# Patient Record
Sex: Male | Born: 1990
Health system: Southern US, Community
[De-identification: ages and names within clinical notes are randomized; demographics above are authoritative.]

## PROBLEM LIST (undated history)

## (undated) DIAGNOSIS — F319 Bipolar disorder, unspecified: Secondary | ICD-10-CM

## (undated) DIAGNOSIS — R519 Headache, unspecified: Secondary | ICD-10-CM

## (undated) DIAGNOSIS — T7840XA Allergy, unspecified, initial encounter: Secondary | ICD-10-CM

## (undated) DIAGNOSIS — R51 Headache: Secondary | ICD-10-CM

## (undated) DIAGNOSIS — F4310 Post-traumatic stress disorder, unspecified: Secondary | ICD-10-CM

## (undated) HISTORY — DX: Headache, unspecified: R51.9

## (undated) HISTORY — DX: Bipolar disorder, unspecified: F31.9

## (undated) HISTORY — PX: WISDOM TOOTH EXTRACTION: SHX21

## (undated) HISTORY — DX: Allergy, unspecified, initial encounter: T78.40XA

## (undated) HISTORY — DX: Headache: R51

---

## 2012-09-29 ENCOUNTER — Emergency Department (HOSPITAL_COMMUNITY): Payer: Self-pay

## 2012-09-29 ENCOUNTER — Encounter (HOSPITAL_COMMUNITY): Payer: Self-pay | Admitting: *Deleted

## 2012-09-29 ENCOUNTER — Emergency Department (HOSPITAL_COMMUNITY)
Admission: EM | Admit: 2012-09-29 | Discharge: 2012-09-29 | Disposition: A | Discharge status: Home / Self Care | Payer: Self-pay | Attending: Emergency Medicine | Admitting: Emergency Medicine

## 2012-09-29 DIAGNOSIS — S43409A Unspecified sprain of unspecified shoulder joint, initial encounter: Secondary | ICD-10-CM

## 2012-09-29 DIAGNOSIS — T733XXA Exhaustion due to excessive exertion, initial encounter: Secondary | ICD-10-CM | POA: Insufficient documentation

## 2012-09-29 DIAGNOSIS — Y929 Unspecified place or not applicable: Secondary | ICD-10-CM | POA: Insufficient documentation

## 2012-09-29 DIAGNOSIS — Y93B1 Activity, exercise machines primarily for muscle strengthening: Secondary | ICD-10-CM | POA: Insufficient documentation

## 2012-09-29 DIAGNOSIS — IMO0002 Reserved for concepts with insufficient information to code with codable children: Secondary | ICD-10-CM | POA: Insufficient documentation

## 2012-09-29 NOTE — ED Notes (Signed)
R shoulder pain 10/10 w/active ROM beyond 90 degrees laterally and vertically.  Full passive ROM intact w/out pain elicited.

## 2012-09-29 NOTE — ED Provider Notes (Signed)
Medical screening examination/treatment/procedure(s) were performed by non-physician practitioner and as supervising physician I was immediately available for consultation/collaboration.   Ginni Eichler L Danne Vasek, MD 09/29/12 2323 

## 2012-09-29 NOTE — ED Notes (Signed)
Lifting weights when pt heard a "popping" noise and felt left shoulder "snap".  C/o decreased ROM and pain to left shoulder.

## 2012-09-29 NOTE — ED Provider Notes (Signed)
History     CSN: [id number]  Arrival date & time 09/29/12  1452   None     Chief Complaint  Patient presents with  . Shoulder Injury    (Consider location/radiation/quality/duration/timing/severity/associated sxs/prior treatment) HPI Comments: Pt was doing squat while holding ~ 150 lbs.  The bar shifted and depressed the L shoulder and he has had pain in since.  R hand dominant.  The history is provided by the patient. No language interpreter was used.    History reviewed. No pertinent past medical history.  History reviewed. No pertinent past surgical history.  No family history on file.  History  Substance Use Topics  . Smoking status: Never Smoker   . Smokeless tobacco: Not on file  . Alcohol Use: No      Review of Systems  Musculoskeletal:       Shoulder injury and pain   Skin: Negative for wound.  All other systems reviewed and are negative.    Allergies  Review of patient's allergies indicates no known allergies.  Home Medications  No current outpatient prescriptions on file.  BP 120/57  Pulse 97  Temp 97.9 F (36.6 C) (Oral)  Resp 20  Ht 5\' 8"  (1.727 m)  Wt 152 lb (68.947 kg)  BMI 23.11 kg/m2  SpO2 100%  Physical Exam  Nursing note and vitals reviewed. Constitutional: He is oriented to person, place, and time. He appears well-developed and well-nourished.  HENT:  Head: Normocephalic and atraumatic.  Eyes: EOM are normal.  Neck: Normal range of motion.  Cardiovascular: Normal rate, regular rhythm, normal heart sounds and intact distal pulses.   Pulmonary/Chest: Effort normal and breath sounds normal. No respiratory distress.  Abdominal: Soft. He exhibits no distension. There is no tenderness.  Musculoskeletal: He exhibits tenderness.       Left shoulder: He exhibits decreased range of motion, tenderness and pain. He exhibits no bony tenderness, no swelling, no effusion, no crepitus, no deformity, no laceration, no spasm, normal pulse and  normal strength.       Arms: Neurological: He is alert and oriented to person, place, and time.  Skin: Skin is warm and dry.  Psychiatric: He has a normal mood and affect. Judgment normal.    ED Course  Procedures (including critical care time)  Labs Reviewed - No data to display Dg Shoulder Left  09/29/2012  *RADIOLOGY REPORT*  Clinical Data: 21 year old male with left shoulder injury and pain.  LEFT SHOULDER - 2+ VIEW  Comparison: None  Findings: There is no evidence of acute bony abnormality. There is no evidence of acute fracture, subluxation, or dislocation. No focal bony lesions are identified. The visualized left hemithorax is unremarkable.  IMPRESSION: Unremarkable left shoulder.   Original Report Authenticated By: Rosendo Gros, M.D.      1. Shoulder sprain       MDM  Sling, ice, ibuprofen F/u with dr. Romeo Apple.        Evalina Field, [place] 09/29/12 [phone]

## 2012-09-29 NOTE — ED Notes (Signed)
Patient with no complaints at this time. Respirations even and unlabored. Skin warm/dry. Discharge instructions reviewed with patient at this time. Patient given opportunity to voice concerns/ask questions. Patient discharged at this time and left Emergency Department with steady gait.   

## 2014-06-25 ENCOUNTER — Telehealth: Payer: Self-pay | Admitting: Family Medicine

## 2014-06-25 NOTE — Telephone Encounter (Signed)
Pt's twin brother and mother sees you. Pt doesn't have a pcp and would like to know if you will accept him as a pt? pls advise

## 2014-06-25 NOTE — Telephone Encounter (Signed)
Yes I can see him  ?

## 2014-06-26 NOTE — Telephone Encounter (Signed)
NA, try back later.

## 2014-06-27 NOTE — Telephone Encounter (Signed)
appt scheduled

## 2014-08-06 ENCOUNTER — Encounter: Payer: Self-pay | Admitting: Family Medicine

## 2014-08-06 ENCOUNTER — Ambulatory Visit (INDEPENDENT_AMBULATORY_CARE_PROVIDER_SITE_OTHER): Payer: BC Managed Care – PPO | Admitting: Family Medicine

## 2014-08-06 VITALS — BP 103/61 | HR 88 | Temp 99.0°F | Ht 69.0 in | Wt 156.0 lb

## 2014-08-06 DIAGNOSIS — Z0000 Encounter for general adult medical examination without abnormal findings: Secondary | ICD-10-CM

## 2014-08-06 LAB — LIPID PANEL
CHOLESTEROL: 114 mg/dL (ref 0–200)
HDL: 52 mg/dL (ref >39.00)
LDL CALC: 54 mg/dL (ref 0–99)
NonHDL: 62
Total CHOL/HDL Ratio: 2
Triglycerides: 42 mg/dL (ref 0.0–149.0)
VLDL: 8.4 mg/dL (ref 0.0–40.0)

## 2014-08-06 LAB — BASIC METABOLIC PANEL
BUN: 12 mg/dL (ref 6–23)
CO2: 29 meq/L (ref 19–32)
Calcium: 9.1 mg/dL (ref 8.4–10.5)
Chloride: 104 mEq/L (ref 96–112)
Creatinine, Ser: 1 mg/dL (ref 0.4–1.5)
GFR: 97.53 mL/min (ref >60.00)
GLUCOSE: 83 mg/dL (ref 70–99)
POTASSIUM: 3.5 meq/L (ref 3.5–5.1)
SODIUM: 141 meq/L (ref 135–145)

## 2014-08-06 LAB — CBC WITH DIFFERENTIAL/PLATELET
BASOS PCT: 1 % (ref 0.0–3.0)
Basophils Absolute: 0 10*3/uL (ref 0.0–0.1)
EOS ABS: 0 10*3/uL (ref 0.0–0.7)
EOS PCT: 1 % (ref 0.0–5.0)
HEMATOCRIT: 41 % (ref 39.0–52.0)
HEMOGLOBIN: 14 g/dL (ref 13.0–17.0)
LYMPHS ABS: 1.6 10*3/uL (ref 0.7–4.0)
Lymphocytes Relative: 35.8 % (ref 12.0–46.0)
MCHC: 34.1 g/dL (ref 30.0–36.0)
MCV: 88.8 fl (ref 78.0–100.0)
MONO ABS: 0.4 10*3/uL (ref 0.1–1.0)
Monocytes Relative: 9.2 % (ref 3.0–12.0)
NEUTROS ABS: 2.4 10*3/uL (ref 1.4–7.7)
NEUTROS PCT: 53 % (ref 43.0–77.0)
Platelets: 210 10*3/uL (ref 150.0–400.0)
RBC: 4.61 Mil/uL (ref 4.22–5.81)
RDW: 12.4 % (ref 11.5–15.5)
WBC: 4.4 10*3/uL (ref 4.0–10.5)

## 2014-08-06 LAB — POCT URINALYSIS DIPSTICK
BILIRUBIN UA: NEGATIVE
Glucose, UA: NEGATIVE
Ketones, UA: NEGATIVE
Leukocytes, UA: NEGATIVE
NITRITE UA: NEGATIVE
PH UA: 7.5
Protein, UA: NEGATIVE
RBC UA: NEGATIVE
SPEC GRAV UA: 1.01
Urobilinogen, UA: 0.2

## 2014-08-06 LAB — HEPATIC FUNCTION PANEL
ALBUMIN: 4.5 g/dL (ref 3.5–5.2)
ALK PHOS: 58 U/L (ref 39–117)
ALT: 24 U/L (ref 0–53)
AST: 38 U/L — AB (ref 0–37)
BILIRUBIN DIRECT: 0 mg/dL (ref 0.0–0.3)
Total Bilirubin: 0.7 mg/dL (ref 0.2–1.2)
Total Protein: 7.4 g/dL (ref 6.0–8.3)

## 2014-08-06 LAB — TSH: TSH: 0.69 u[IU]/mL (ref 0.35–4.50)

## 2014-08-06 NOTE — Progress Notes (Signed)
   Subjective:    Patient ID: David [name], male    DOB: 1991-09-21, 23 y.o.   MRN: [id number]  HPI 23 yr old male to establish and for a cpx. He feels great. He is working part time at [employer] and he takes classes at [employer] to study [employer].    Review of Systems  Constitutional: Negative.   HENT: Negative.   Eyes: Negative.   Respiratory: Negative.   Cardiovascular: Negative.   Gastrointestinal: Negative.   Genitourinary: Negative.   Musculoskeletal: Negative.   Skin: Negative.   Neurological: Negative.   Psychiatric/Behavioral: Negative.        Objective:   Physical Exam  Constitutional: He is oriented to person, place, and time. He appears well-developed and well-nourished. No distress.  HENT:  Head: Normocephalic and atraumatic.  Right Ear: External ear normal.  Left Ear: External ear normal.  Nose: Nose normal.  Mouth/Throat: Oropharynx is clear and moist. No oropharyngeal exudate.  Eyes: Conjunctivae and EOM are normal. Pupils are equal, round, and reactive to light. Right eye exhibits no discharge. Left eye exhibits no discharge. No scleral icterus.  Neck: Neck supple. No JVD present. No tracheal deviation present. No thyromegaly present.  Cardiovascular: Normal rate, regular rhythm, normal heart sounds and intact distal pulses.  Exam reveals no gallop and no friction rub.   No murmur heard. Pulmonary/Chest: Effort normal and breath sounds normal. No respiratory distress. He has no wheezes. He has no rales. He exhibits no tenderness.  Abdominal: Soft. Bowel sounds are normal. He exhibits no distension and no mass. There is no tenderness. There is no rebound and no guarding.  Genitourinary: Rectum normal, prostate normal and penis normal. Guaiac negative stool. No penile tenderness.  Musculoskeletal: Normal range of motion. He exhibits no edema and no tenderness.  Lymphadenopathy:    He has no cervical adenopathy.  Neurological: He is alert and  oriented to person, place, and time. He has normal reflexes. No cranial nerve deficit. He exhibits normal muscle tone. Coordination normal.  Skin: Skin is warm and dry. No rash noted. He is not diaphoretic. No erythema. No pallor.  Psychiatric: He has a normal mood and affect. His behavior is normal. Judgment and thought content normal.          Assessment & Plan:  Well exam. Get labs today

## 2014-08-06 NOTE — Progress Notes (Signed)
Pre visit review using our clinic review tool, if applicable. No additional management support is needed unless otherwise documented below in the visit note. 

## 2017-03-29 ENCOUNTER — Encounter: Payer: Self-pay | Admitting: Family Medicine

## 2017-03-29 ENCOUNTER — Ambulatory Visit (INDEPENDENT_AMBULATORY_CARE_PROVIDER_SITE_OTHER): Payer: BLUE CROSS/BLUE SHIELD | Admitting: Family Medicine

## 2017-03-29 VITALS — BP 131/79 | HR 84 | Temp 98.7°F | Ht 69.0 in | Wt 173.0 lb

## 2017-03-29 DIAGNOSIS — M542 Cervicalgia: Secondary | ICD-10-CM | POA: Diagnosis not present

## 2017-03-29 DIAGNOSIS — G8929 Other chronic pain: Secondary | ICD-10-CM | POA: Diagnosis not present

## 2017-03-29 MED ORDER — DICLOFENAC SODIUM 75 MG PO TBEC: 75.0000 mg | DELAYED_RELEASE_TABLET | Freq: Two times a day (BID) | ORAL | 0 refills | Status: DC

## 2017-03-29 NOTE — Progress Notes (Signed)
   Subjective:    Patient ID: David [name], male    DOB: 1990/12/21, 26 y.o.   MRN: [id number]  HPI Here for a neck pain that started 3 years ago while he was lifting weights. That day he felt a sudden severe pain that eased up over several days, but a dull pain has lasted ever since. He feels stiffness when he sits for a long time or gets out of bed. Advil and heat do not help. He has continued to lift weights regularly.    Review of Systems  Constitutional: Negative.   Musculoskeletal: Positive for neck pain and neck stiffness.       Objective:   Physical Exam  Constitutional: He appears well-developed and well-nourished. No distress.  Musculoskeletal:  Slightly tender over the lower neck around the C6-C7 level. Full ROM          Assessment & Plan:  Chronic neck pain. Try Diclofenac prn. Refer to Sports Medicine.  Gershon Crane, MD

## 2017-03-29 NOTE — Patient Instructions (Signed)
WE NOW OFFER   [facility] FAST TRACK!!!  SAME DAY Appointments for ACUTE CARE  Such as: Sprains, Injuries, cuts, abrasions, rashes, muscle pain, joint pain, back pain Colds, flu, sore throats, headache, allergies, cough, fever  Ear pain, sinus and eye infections Abdominal pain, nausea, vomiting, diarrhea, upset stomach Animal/insect bites  3 Easy Ways to Schedule: Walk-In Scheduling Call in scheduling Mychart Sign-up: [email or web address]         

## 2017-03-29 NOTE — Progress Notes (Signed)
Pre visit review using our clinic review tool, if applicable. No additional management support is needed unless otherwise documented below in the visit note. 

## 2018-06-27 ENCOUNTER — Encounter (HOSPITAL_COMMUNITY): Payer: Self-pay

## 2018-06-27 ENCOUNTER — Emergency Department (HOSPITAL_COMMUNITY)
Admission: EM | Admit: 2018-06-27 | Discharge: 2018-06-28 | Disposition: A | Discharge status: Home / Self Care | Payer: BLUE CROSS/BLUE SHIELD | Attending: Emergency Medicine | Admitting: Emergency Medicine

## 2018-06-27 ENCOUNTER — Other Ambulatory Visit: Payer: Self-pay

## 2018-06-27 DIAGNOSIS — Z889 Allergy status to unspecified drugs, medicaments and biological substances status: Secondary | ICD-10-CM | POA: Insufficient documentation

## 2018-06-27 DIAGNOSIS — R251 Tremor, unspecified: Secondary | ICD-10-CM | POA: Diagnosis not present

## 2018-06-27 DIAGNOSIS — F419 Anxiety disorder, unspecified: Secondary | ICD-10-CM | POA: Diagnosis not present

## 2018-06-27 DIAGNOSIS — Z79899 Other long term (current) drug therapy: Secondary | ICD-10-CM | POA: Diagnosis not present

## 2018-06-27 DIAGNOSIS — T428X5A Adverse effect of antiparkinsonism drugs and other central muscle-tone depressants, initial encounter: Secondary | ICD-10-CM | POA: Diagnosis not present

## 2018-06-27 DIAGNOSIS — R000 Tachycardia, unspecified: Secondary | ICD-10-CM | POA: Diagnosis not present

## 2018-06-27 DIAGNOSIS — T50905A Adverse effect of unspecified drugs, medicaments and biological substances, initial encounter: Secondary | ICD-10-CM

## 2018-06-27 LAB — PROTIME-INR
INR: 1.1
PROTHROMBIN TIME: 14.1 s (ref 11.4–15.2)

## 2018-06-27 LAB — DIFFERENTIAL
Abs Immature Granulocytes: 0 10*3/uL (ref 0.0–0.1)
BASOS ABS: 0.1 10*3/uL (ref 0.0–0.1)
Basophils Relative: 1 %
EOS ABS: 0.1 10*3/uL (ref 0.0–0.7)
EOS PCT: 1 %
IMMATURE GRANULOCYTES: 0 %
LYMPHS PCT: 28 %
Lymphs Abs: 2.1 10*3/uL (ref 0.7–4.0)
MONO ABS: 0.8 10*3/uL (ref 0.1–1.0)
Monocytes Relative: 10 %
NEUTROS PCT: 60 %
Neutro Abs: 4.4 10*3/uL (ref 1.7–7.7)

## 2018-06-27 LAB — COMPREHENSIVE METABOLIC PANEL
ALBUMIN: 4.8 g/dL (ref 3.5–5.0)
ALK PHOS: 64 U/L (ref 38–126)
ALT: 21 U/L (ref 0–44)
AST: 26 U/L (ref 15–41)
Anion gap: 12 (ref 5–15)
BILIRUBIN TOTAL: 0.6 mg/dL (ref 0.3–1.2)
BUN: 12 mg/dL (ref 6–20)
CALCIUM: 9.3 mg/dL (ref 8.9–10.3)
CO2: 28 mmol/L (ref 22–32)
CREATININE: 1.12 mg/dL (ref 0.61–1.24)
Chloride: 99 mmol/L (ref 98–111)
GFR calc non Af Amer: >60 mL/min (ref >60)
GLUCOSE: 92 mg/dL (ref 70–99)
Potassium: 3.7 mmol/L (ref 3.5–5.1)
SODIUM: 139 mmol/L (ref 135–145)
Total Protein: 7.3 g/dL (ref 6.5–8.1)

## 2018-06-27 LAB — I-STAT CHEM 8, ED
BUN: 14 mg/dL (ref 6–20)
CALCIUM ION: 1.1 mmol/L — AB (ref 1.15–1.40)
CHLORIDE: 96 mmol/L — AB (ref 98–111)
Creatinine, Ser: 1.2 mg/dL (ref 0.61–1.24)
GLUCOSE: 92 mg/dL (ref 70–99)
HCT: 43 % (ref 39.0–52.0)
Hemoglobin: 14.6 g/dL (ref 13.0–17.0)
Potassium: 3.6 mmol/L (ref 3.5–5.1)
Sodium: 139 mmol/L (ref 135–145)
TCO2: 30 mmol/L (ref 22–32)

## 2018-06-27 LAB — CBC
HCT: 43.5 % (ref 39.0–52.0)
Hemoglobin: 14.8 g/dL (ref 13.0–17.0)
MCH: 29.8 pg (ref 26.0–34.0)
MCHC: 34 g/dL (ref 30.0–36.0)
MCV: 87.7 fL (ref 78.0–100.0)
PLATELETS: 259 10*3/uL (ref 150–400)
RBC: 4.96 MIL/uL (ref 4.22–5.81)
RDW: 11.4 % — AB (ref 11.5–15.5)
WBC: 7.5 10*3/uL (ref 4.0–10.5)

## 2018-06-27 LAB — APTT: APTT: 30 s (ref 24–36)

## 2018-06-27 LAB — I-STAT TROPONIN, ED: Troponin i, poc: 0 ng/mL (ref 0.00–0.08)

## 2018-06-27 NOTE — ED Provider Notes (Addendum)
[facility] EMERGENCY DEPARTMENT Provider Note   CSN: [id number] Arrival date & time: 06/27/18  2106     History   Chief Complaint Chief Complaint  Patient presents with  . Allergic Reaction    HPI David [name] is a 27 y.o. male.  The history is provided by the patient.  Allergic Reaction  Presenting symptoms: no difficulty breathing, no difficulty swallowing, no itching, no rash, no swelling and no wheezing   Severity:  Mild Prior allergic episodes:  No prior episodes Context: medications   Context: not new detergents/soaps   Relieved by:  Nothing Worsened by:  Nothing Ineffective treatments:  None tried Ingestion  This is a new problem. The current episode started 12 to 24 hours ago. The problem occurs constantly. The problem has not changed since onset.Pertinent negatives include no chest pain and no abdominal pain. Nothing aggravates the symptoms. Nothing relieves the symptoms. He has tried nothing for the symptoms. The treatment provided significant relief.  Patient feels anxious after taking a bunch of over the counter supplements including dopa macuna.  Felt jittery.  Has not been unresponsive but has felt shaky.  No loss of control of the bowels or bladder.  No tongue biting.  No wheezing no rashes on the skin.  No swelling of the lips tongue or uvula.  Family is concerned about ongoing brain fog and delusional issues.    Past Medical History:  Diagnosis Date  . Allergy     Patient Active Problem List   Diagnosis Date Noted  . Chronic neck pain 03/29/2017    History reviewed. No pertinent surgical history.      Home Medications    Prior to Admission medications   Medication Sig Start Date End Date Taking? Authorizing Provider  diclofenac (VOLTAREN) 75 MG EC tablet Take 1 tablet (75 mg total) by mouth 2 (two) times daily. 03/29/17   Nelwyn Salisbury, MD    Family History Family History  Problem Relation Age of Onset  . Fibromyalgia  Brother     Social History Social History   Tobacco Use  . Smoking status: Never Smoker  . Smokeless tobacco: Never Used  Substance Use Topics  . Alcohol use: No  . Drug use: No     Allergies   Patient has no known allergies.   Review of Systems Review of Systems  Constitutional: Negative for fever.  HENT: Negative for trouble swallowing.   Eyes: Negative for photophobia and visual disturbance.  Respiratory: Negative for choking, chest tightness and wheezing.   Cardiovascular: Negative for chest pain and leg swelling.  Gastrointestinal: Negative for abdominal pain.  Genitourinary: Negative for flank pain.  Skin: Negative for itching and rash.  Neurological: Negative for seizures and syncope.  Psychiatric/Behavioral: Negative for agitation, behavioral problems, confusion, decreased concentration, dysphoric mood, hallucinations, self-injury, sleep disturbance and suicidal ideas. The patient is nervous/anxious. The patient is not hyperactive.   All other systems reviewed and are negative.    Physical Exam Updated Vital Signs BP (!) 161/85 (BP Location: Right Arm)   Pulse (!) 114   Temp 98.5 F (36.9 C) (Oral)   Resp 18   Ht 5\' 9"  (1.753 m)   Wt 70.3 kg (155 lb)   SpO2 99%   BMI 22.89 kg/m   Physical Exam  Constitutional: He is oriented to person, place, and time. He appears well-developed and well-nourished. No distress.  HENT:  Head: Normocephalic and atraumatic.  Mouth/Throat: Oropharynx is clear and moist. No oropharyngeal  exudate.  Eyes: Pupils are equal, round, and reactive to light. Conjunctivae are normal.  Neck: Normal range of motion. Neck supple.  Cardiovascular: Normal rate, regular rhythm, normal heart sounds and intact distal pulses.  Pulmonary/Chest: Effort normal and breath sounds normal. No stridor. No respiratory distress. He has no wheezes. He has no rales.  Abdominal: Soft. Bowel sounds are normal. He exhibits no mass. There is no tenderness.  There is no rebound and no guarding.  Musculoskeletal: Normal range of motion.  Neurological: He is alert and oriented to person, place, and time. He displays normal reflexes. No cranial nerve deficit. He exhibits normal muscle tone. Coordination normal.  Skin: Skin is warm and dry. Capillary refill takes less than 2 seconds.  Psychiatric: His mood appears not anxious. His speech is not rapid and/or pressured, not delayed and not tangential. Thought content is not paranoid. He expresses no homicidal and no suicidal ideation. He expresses no suicidal plans and no homicidal plans.  Patient is currently alert and oriented.  No si or HI no AH or VH.    Nursing note and vitals reviewed.    ED Treatments / Results  Labs (all labs ordered are listed, but only abnormal results are displayed) Results for orders placed or performed during the hospital encounter of 06/27/18  Protime-INR  Result Value Ref Range   Prothrombin Time 14.1 11.4 - 15.2 seconds   INR 1.10   APTT  Result Value Ref Range   aPTT 30 24 - 36 seconds  CBC  Result Value Ref Range   WBC 7.5 4.0 - 10.5 K/uL   RBC 4.96 4.22 - 5.81 MIL/uL   Hemoglobin 14.8 13.0 - 17.0 g/dL   HCT 96.043.5 45.439.0 - 09.852.0 %   MCV 87.7 78.0 - 100.0 fL   MCH 29.8 26.0 - 34.0 pg   MCHC 34.0 30.0 - 36.0 g/dL   RDW 11.911.4 (L) 14.711.5 - 82.915.5 %   Platelets 259 150 - 400 K/uL  Differential  Result Value Ref Range   Neutrophils Relative % 60 %   Neutro Abs 4.4 1.7 - 7.7 K/uL   Lymphocytes Relative 28 %   Lymphs Abs 2.1 0.7 - 4.0 K/uL   Monocytes Relative 10 %   Monocytes Absolute 0.8 0.1 - 1.0 K/uL   Eosinophils Relative 1 %   Eosinophils Absolute 0.1 0.0 - 0.7 K/uL   Basophils Relative 1 %   Basophils Absolute 0.1 0.0 - 0.1 K/uL   Immature Granulocytes 0 %   Abs Immature Granulocytes 0.0 0.0 - 0.1 K/uL  Comprehensive metabolic panel  Result Value Ref Range   Sodium 139 135 - 145 mmol/L   Potassium 3.7 3.5 - 5.1 mmol/L   Chloride 99 98 - 111 mmol/L    CO2 28 22 - 32 mmol/L   Glucose, Bld 92 70 - 99 mg/dL   BUN 12 6 - 20 mg/dL   Creatinine, Ser 5.621.12 0.61 - 1.24 mg/dL   Calcium 9.3 8.9 - 13.010.3 mg/dL   Total Protein 7.3 6.5 - 8.1 g/dL   Albumin 4.8 3.5 - 5.0 g/dL   AST 26 15 - 41 U/L   ALT 21 0 - 44 U/L   Alkaline Phosphatase 64 38 - 126 U/L   Total Bilirubin 0.6 0.3 - 1.2 mg/dL   GFR calc non Af Amer >60 >60 mL/min   GFR calc Af Amer >60 >60 mL/min   Anion gap 12 5 - 15  I-stat troponin, ED  Result Value Ref  Range   Troponin i, poc 0.00 0.00 - 0.08 ng/mL   Comment 3          I-Stat Chem 8, ED  Result Value Ref Range   Sodium 139 135 - 145 mmol/L   Potassium 3.6 3.5 - 5.1 mmol/L   Chloride 96 (L) 98 - 111 mmol/L   BUN 14 6 - 20 mg/dL   Creatinine, Ser 1.61 0.61 - 1.24 mg/dL   Glucose, Bld 92 70 - 99 mg/dL   Calcium, Ion 0.96 (L) 1.15 - 1.40 mmol/L   TCO2 30 22 - 32 mmol/L   Hemoglobin 14.6 13.0 - 17.0 g/dL   HCT 04.5 40.9 - 81.1 %   No results found.  EKG EKG Interpretation  Date/Time:  Wednesday June 27 2018 21:13:50 EDT Ventricular Rate:  113 PR Interval:  134 QRS Duration: 100 QT Interval:  318 QTC Calculation: 436 R Axis:   54 Text Interpretation:  Sinus tachycardia Right atrial enlargement Moderate voltage criteria for LVH, may be normal variant Borderline ECG No old tracing to compare Confirmed by Melene Plan [id number]) on 06/27/2018 10:21:11 PM   Radiology No results found.  Procedures Procedures (including critical care time)   Final Clinical Impressions(s) / ED Diagnoses  Stop using all these substances. Follow up with your PMD, appointment is this am at 1030.    Return for pain, numbness, changes in vision or speech, fevers >100.4 unrelieved by medication, shortness of breath, intractable vomiting, or diarrhea, abdominal pain, Inability to tolerate liquids or food, cough, altered mental status or any concerns. No signs of systemic illness or infection. The patient is nontoxic-appearing on exam and vital  signs are within normal limits. Will refer to urology for microscopy hematuria as patient is asymptomatic.  I have reviewed the triage vital signs and the nursing notes. Pertinent labs &imaging results that were available during my care of the patient were reviewed by me and considered in my medical decision making (see chart for details).  After history, exam, and medical workup I feel the patient has been appropriately medically screened and is safe for discharge home. Pertinent diagnoses were discussed with the patient. Patient was given return precautions.   Yeray Tomas, MD 06/28/18 [place], Khyan Oats, MD 06/28/18 Azzie Roup, Ladina Shutters, MD 06/28/18 0130

## 2018-06-27 NOTE — ED Triage Notes (Signed)
Pt states that he has been using herbal supplements and tried a new one today called mucuna dopa, pt states that afterwards shaking, anxious and pt thinks that he has been having seizures, reports he was unable to walk, speak and confused for 15 minutes. LSN 5pm neuro intact bilaterally. Reports dizziness now

## 2018-06-28 ENCOUNTER — Other Ambulatory Visit: Payer: Self-pay

## 2018-06-28 ENCOUNTER — Encounter (HOSPITAL_COMMUNITY): Payer: Self-pay | Admitting: Emergency Medicine

## 2018-06-28 ENCOUNTER — Ambulatory Visit: Payer: BLUE CROSS/BLUE SHIELD | Admitting: Family Medicine

## 2018-06-28 LAB — ACETAMINOPHEN LEVEL: Acetaminophen (Tylenol), Serum: <10 ug/mL — ABNORMAL LOW (ref 10–30)

## 2018-06-28 LAB — RAPID URINE DRUG SCREEN, HOSP PERFORMED
Amphetamines: NOT DETECTED
BARBITURATES: NOT DETECTED
BENZODIAZEPINES: NOT DETECTED
COCAINE: NOT DETECTED
Opiates: NOT DETECTED
TETRAHYDROCANNABINOL: NOT DETECTED

## 2018-06-28 LAB — SALICYLATE LEVEL: Salicylate Lvl: <7 mg/dL (ref 2.8–30.0)

## 2018-06-28 NOTE — ED Notes (Signed)
Pt ambulated in hallway on own ability. Pt has steady normal gait. No complaints.

## 2018-06-28 NOTE — ED Notes (Signed)
Pt given ginger ale soda and water

## 2018-07-02 ENCOUNTER — Encounter: Payer: Self-pay | Admitting: Family Medicine

## 2018-07-02 ENCOUNTER — Ambulatory Visit (INDEPENDENT_AMBULATORY_CARE_PROVIDER_SITE_OTHER): Payer: BLUE CROSS/BLUE SHIELD | Admitting: Family Medicine

## 2018-07-02 VITALS — BP 120/88 | HR 76 | Temp 98.1°F | Ht 69.0 in | Wt 152.6 lb

## 2018-07-02 DIAGNOSIS — R51 Headache: Secondary | ICD-10-CM

## 2018-07-02 DIAGNOSIS — F22 Delusional disorders: Secondary | ICD-10-CM | POA: Diagnosis not present

## 2018-07-02 DIAGNOSIS — R519 Headache, unspecified: Secondary | ICD-10-CM

## 2018-07-02 DIAGNOSIS — F419 Anxiety disorder, unspecified: Secondary | ICD-10-CM

## 2018-07-02 MED ORDER — LORAZEPAM 0.5 MG PO TABS: 0.5000 mg | ORAL_TABLET | Freq: Two times a day (BID) | ORAL | 0 refills | Status: DC | PRN
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
## 2018-07-03 ENCOUNTER — Telehealth: Payer: Self-pay | Admitting: *Deleted

## 2018-07-03 NOTE — Telephone Encounter (Signed)
Pt's CT scheduled for tomorrow.

## 2018-07-03 NOTE — Telephone Encounter (Signed)
[place]'s father [place] called in and spoke w/ [facility] to check on status of scheduling CT scan and requested to know location of scan. Advised agent that scan was just ordered yesterday evening and was still undergoing pre-cert, etc and that location is [place] [address] Agent relayed info to father, who told her that he called "[facility]" and they told him they only do consultations but not CT scans. He could not elaborate on which [place] office he called. Father is requesting to have scan scheduled ASAP.   Discussed w/ Dr. Clent RidgesFry to see if he wanted the order placed stat, but he said scheduling within 1 week is okay. Deb/Sheena, can you check on status of imaging authorization and call patient's father with an update today please?

## 2018-07-04 ENCOUNTER — Ambulatory Visit (INDEPENDENT_AMBULATORY_CARE_PROVIDER_SITE_OTHER)
Admission: RE | Admit: 2018-07-04 | Discharge: 2018-07-04 | Disposition: A | Discharge status: Home / Self Care | Payer: BLUE CROSS/BLUE SHIELD | Source: Ambulatory Visit | Attending: Family Medicine | Admitting: Family Medicine

## 2018-07-04 DIAGNOSIS — R51 Headache: Secondary | ICD-10-CM

## 2018-07-04 DIAGNOSIS — R519 Headache, unspecified: Secondary | ICD-10-CM

## 2018-07-05 ENCOUNTER — Telehealth: Payer: Self-pay | Admitting: Family Medicine

## 2018-07-05 NOTE — Telephone Encounter (Signed)
Stacy from [facility] has already scheduled pt mother is aware

## 2018-07-05 NOTE — Telephone Encounter (Signed)
Stacey from [facility] scheduled pt mother is aware

## 2018-07-06 ENCOUNTER — Ambulatory Visit: Payer: BLUE CROSS/BLUE SHIELD | Admitting: Neurology

## 2018-07-13 NOTE — Progress Notes (Signed)
NEUROLOGY CONSULTATION NOTE  David [name] MRN: [id number] DOB: 02-09-1991  Referring provider: Gershon Crane, MD Primary care provider: Gershon Crane, MD  Reason for consult:  Headache  HISTORY OF PRESENT ILLNESS: David [name] is a 27 year old right-handed male who presents for headache.  History supplemented by referring provider's note.  He started experiencing anxiety and panic attacks 2 to 3 months ago.  He reports no specific trigger.  He started taking multiple supplements, including dopa macuna.  He presented to the ED on 06/27/18 for further evaluation.  Vitals were normal.  EKG was normal.  Labs including CBC, CMP, troponin, rapid urine drug screen, salicylate and acetaminophen levels were normal except for microscopy hematuria.    CT of head without contrast from 07/04/18 was personally reviewed and is normal.   Onset:  Prone to headaches since childhood.  However daily for past 2 months.  About 1 to 2 months prior, he hit his head on barbell.  No loss of consciousness.   Location:  Varies - bifrontal, unilateral temporal, generalized Quality:  Pounding, vice grip, sharp Intensity:  Moderate.  He denies new headache, thunderclap headache or severe headache that wakes him from sleep. Aura:  Smells cinnamon buns (associated with panic attacks) Prodrome:  no Postdrome:  no Associated symptoms:  Nausea. Sometimes with panic attacks as described below.  He denies associated photophobia, phonophobia, osmophobia or unilateral numbness or weakness. Duration:  2 hours Frequency:  daily Frequency of abortive medication: Advil (daily) Triggers:  Unclear Exacerbating factors:  Panic attack Relieving factors:  Advil Activity:  Not aggravates  Panic attacks:  Severe anxiety, palpitations, felt cold sensation from right side of head to left and then diffuse and phantosmia (cinnamon rolls).  Started 2 to 3 months ago.  He wakes up with anxiety and diaphoretic.  Sometimes he notes facial  tic.  He reports OCD symptoms and fixates on anxiety.  For some time, he had paranoid thoughts but that has resolved.  He is concerned about temporal lobe seizures.  Current NSAIDS:  Advil 400mg  Current analgesics:  no Current triptans:  no Current ergotamine:  no Current anti-emetic:  no Current muscle relaxants:  no Current anti-anxiolytic:  Ativan Current sleep aide:  melatonin Current Antihypertensive medications:  no Current Antidepressant medications:  no Current Anticonvulsant medications:  no Current anti-CGRP:  no Current Vitamins/Herbal/Supplements:  no Current Antihistamines/Decongestants:  no Other therapy:  no  Past NSAIDS:  no Past analgesics:  Tylenol Past abortive triptans:  no Past abortive ergotamine:  no Past muscle relaxants:  no Past anti-emetic:  no Past antihypertensive medications:  no Past antidepressant medications:  no Past anticonvulsant medications:  no Past anti-CGRP:  no Past vitamins/Herbal/Supplements:  no Past antihistamines/decongestants:  no Other past therapies:  no  Alcohol/Drug use:  no Smoker:  no Depression:  slight; Anxiety:  yes Sleep hygiene:  Wakes up often in panic attack.  Difficulty falling asleep Family history of headache:  mother Other family history: mother (anxiety, possible Bipolar), father (anxiety).  Paternal aunt had aneurysm.  Brother with fibromyalgia.  PAST MEDICAL HISTORY: Past Medical History:  Diagnosis Date  . Allergy     PAST SURGICAL HISTORY: No past surgical history on file.  MEDICATIONS: Current Outpatient Medications on File Prior to Visit  Medication Sig Dispense Refill  . LORazepam (ATIVAN) 0.5 MG tablet Take 1 tablet (0.5 mg total) by mouth 2 (two) times daily as needed for anxiety. 60 tablet 0   No current facility-administered medications on file  prior to visit.     ALLERGIES: No Known Allergies  FAMILY HISTORY: Family History  Problem Relation Age of Onset  . Fibromyalgia Brother       SOCIAL HISTORY: Social History   Socioeconomic History  . Marital status: Single    Spouse name: Not on file  . Number of children: Not on file  . Years of education: Not on file  . Highest education level: Not on file  Occupational History  . Not on file  Social Needs  . Financial resource strain: Not on file  . Food insecurity:    Worry: Not on file    Inability: Not on file  . Transportation needs:    Medical: Not on file    Non-medical: Not on file  Tobacco Use  . Smoking status: Never Smoker  . Smokeless tobacco: Never Used  Substance and Sexual Activity  . Alcohol use: No  . Drug use: No  . Sexual activity: Not on file  Lifestyle  . Physical activity:    Days per week: Not on file    Minutes per session: Not on file  . Stress: Not on file  Relationships  . Social connections:    Talks on phone: Not on file    Gets together: Not on file    Attends religious service: Not on file    Active member of club or organization: Not on file    Attends meetings of clubs or organizations: Not on file    Relationship status: Not on file  . Intimate partner violence:    Fear of current or ex partner: Not on file    Emotionally abused: Not on file    Physically abused: Not on file    Forced sexual activity: Not on file  Other Topics Concern  . Not on file  Social History Narrative  . Not on file    REVIEW OF SYSTEMS: Constitutional: No fevers, chills, or sweats, no generalized fatigue, change in appetite Eyes: No visual changes, double vision, eye pain Ear, nose and throat: No hearing loss, ear pain, nasal congestion, sore throat Cardiovascular: No chest pain, palpitations Respiratory:  No shortness of breath at rest or with exertion, wheezes GastrointestinaI: No nausea, vomiting, diarrhea, abdominal pain, fecal incontinence Genitourinary:  No dysuria, urinary retention or frequency Musculoskeletal:  No neck pain, back pain Integumentary: No rash, pruritus, skin  lesions Neurological: as above Psychiatric: No depression, insomnia, anxiety Endocrine: No palpitations, fatigue, diaphoresis, mood swings, change in appetite, change in weight, increased thirst Hematologic/Lymphatic:  No purpura, petechiae. Allergic/Immunologic: no itchy/runny eyes, nasal congestion, recent allergic reactions, rashes  PHYSICAL EXAM: Blood pressure 108/78, pulse (!) 112, height 5\' 9"  (1.753 m), weight 153 lb (69.4 kg), SpO2 98 %. General: No acute distress.  Patient appears well-groomed.  Head:  Normocephalic/atraumatic Eyes:  fundi examined but not visualized Neck: supple, no paraspinal tenderness, full range of motion Back: No paraspinal tenderness Heart: regular rate and rhythm Lungs: Clear to auscultation bilaterally. Vascular: No carotid bruits. Neurological Exam: Mental status: alert and oriented to person, place, and time, recent and remote memory intact, fund of knowledge intact, attention and concentration intact, speech fluent and not dysarthric, language intact. Cranial nerves: CN I: not tested CN II: pupils equal, round and reactive to light, visual fields intact CN III, IV, VI:  full range of motion, no nystagmus, no ptosis CN V: facial sensation intact CN VII: upper and lower face symmetric CN VIII: hearing intact CN IX, X: gag intact,  uvula midline CN XI: sternocleidomastoid and trapezius muscles intact CN XII: tongue midline Bulk & Tone: normal, no fasciculations. Motor:  5/5 throughout  Sensation: temperature and vibration sensation intact. Deep Tendon Reflexes:  2+ throughout, toes downgoing.  Finger to nose testing:  Without dysmetria.  Heel to shin:  Without dysmetria.  Gait:  Normal station and stride.  Able to turn and tandem walk. Romberg negative.  IMPRESSION: 1.  Migraine with aura (phantosmia, although it is more associated with the panic attacks), not intractable 2.  Anxiety/panic attacks  He has migraines.  My suspicion for  seizures is low, however I think an EEG is appropriate.  I think his panic attacks and anxiety is likely psychiatric.  PLAN: 1.  We will check EEG.  If unremarkable, no further neurologic workup warranted. 2.  Discussed risk of rebound headache and should limit Advil to no more than 2 days out of week. 3.  Keep headache diary 4.  He should establish care with a psychiatrist as treatment for his anxiety would likely help reduce his migraines.  I suggested venlafaxine, which is effective for both migraines and anxiety.  However, I think appropriate medication for anxiety should be addressed by psychiatry first.  Another option would be topiramate.  He would like to hold off on starting a daily migraine preventative for now. 5.  He will follow up if EEG suspicious or if he wishes to start a preventative.  Thank you for allowing me to take part in the care of this patient.  Shon Millet, DO  CC:  Gershon Crane, MD

## 2018-07-16 ENCOUNTER — Ambulatory Visit (INDEPENDENT_AMBULATORY_CARE_PROVIDER_SITE_OTHER): Payer: BLUE CROSS/BLUE SHIELD | Admitting: Neurology

## 2018-07-16 ENCOUNTER — Encounter: Payer: Self-pay | Admitting: Neurology

## 2018-07-16 VITALS — BP 108/78 | HR 112 | Ht 69.0 in | Wt 153.0 lb

## 2018-07-16 DIAGNOSIS — F410 Panic disorder [episodic paroxysmal anxiety] without agoraphobia: Secondary | ICD-10-CM

## 2018-07-16 DIAGNOSIS — R442 Other hallucinations: Secondary | ICD-10-CM

## 2018-07-16 DIAGNOSIS — G43109 Migraine with aura, not intractable, without status migrainosus: Secondary | ICD-10-CM | POA: Diagnosis not present

## 2018-07-16 NOTE — Patient Instructions (Signed)
1.  We will check EEG.  If unremarkable, I do not suspect seizures. 2.  I recommend having Dr. Clent RidgesFry refer you to a psychiatrist.  Discuss possibility of starting Effexor, which is used to treat both anxiety and migraines. 3.  Limit pain relievers to no more than 2 days out of week to prevent rebound headache 4.  Follow up if you wish to start a daily migraine preventative.

## 2018-07-17 ENCOUNTER — Encounter: Payer: Self-pay | Admitting: Family Medicine

## 2018-07-18 ENCOUNTER — Other Ambulatory Visit: Payer: Self-pay

## 2018-07-18 MED ORDER — ZALEPLON 5 MG PO CAPS: 5.0000 mg | ORAL_CAPSULE | Freq: Every evening | ORAL | 2 refills | Status: DC | PRN

## 2018-07-18 NOTE — Telephone Encounter (Signed)
We can try that. Call in Sonata 5 mg to take qhs, #30 with 2 rf

## 2018-07-18 NOTE — Telephone Encounter (Signed)
Rx has been called in. Patient notified Through MyChart reply.

## 2018-07-23 ENCOUNTER — Ambulatory Visit (INDEPENDENT_AMBULATORY_CARE_PROVIDER_SITE_OTHER): Payer: BLUE CROSS/BLUE SHIELD | Admitting: Neurology

## 2018-07-23 DIAGNOSIS — R442 Other hallucinations: Secondary | ICD-10-CM | POA: Diagnosis not present

## 2018-07-27 ENCOUNTER — Encounter: Payer: Self-pay | Admitting: Family Medicine

## 2018-07-27 NOTE — Telephone Encounter (Signed)
Dr. Fry patient. 

## 2018-07-27 NOTE — Telephone Encounter (Signed)
Actually we do not refer to psychiatrists, he can make his own appt. Just pick one in his network

## 2018-08-08 ENCOUNTER — Encounter: Payer: Self-pay | Admitting: Family Medicine

## 2018-08-09 NOTE — Telephone Encounter (Signed)
These results are not available yet

## 2018-08-16 ENCOUNTER — Encounter: Payer: Self-pay | Admitting: Family Medicine

## 2018-08-17 ENCOUNTER — Other Ambulatory Visit: Payer: Self-pay | Admitting: Family Medicine

## 2018-08-17 MED ORDER — LORAZEPAM 0.5 MG PO TABS: 0.5000 mg | ORAL_TABLET | Freq: Two times a day (BID) | ORAL | 2 refills | Status: DC | PRN

## 2018-08-17 NOTE — Telephone Encounter (Signed)
Call in #60 with 2 rf 

## 2018-08-17 NOTE — Telephone Encounter (Signed)
Last OV  07/02/2018   Last refilled 07/02/2018 disp 60 with no refills   Sent to PCP to advise

## 2018-08-17 NOTE — Telephone Encounter (Signed)
Call in Buspar 5 mg to take BID, #60 with 2 rf

## 2018-08-19 ENCOUNTER — Encounter: Payer: Self-pay | Admitting: Family Medicine

## 2018-08-20 MED ORDER — BUSPIRONE HCL 5 MG PO TABS: 5.0000 mg | ORAL_TABLET | Freq: Two times a day (BID) | ORAL | 2 refills | Status: DC

## 2018-08-20 NOTE — Telephone Encounter (Signed)
I agree this is worth a try. Call in Gabapentin 100 mg bid, #60 with 2 rf

## 2018-08-20 NOTE — Procedures (Signed)
ELECTROENCEPHALOGRAM REPORT  Date of Study: 07/23/2018  Patient's Name: David [name] MRN: [id number] Date of Birth: [birth year]  Clinical History: 27 year old male with tics and panic attacks presents for spells.  Medications: Ativan  Technical Summary: A multichannel digital EEG recording measured by the international 10-20 system with electrodes applied with paste and impedances below 5000 ohms performed in our laboratory with EKG monitoring in an awake and drowsy patient.  Hyperventilation and photic stimulation were performed.  The digital EEG was referentially recorded, reformatted, and digitally filtered in a variety of bipolar and referential montages for optimal display.    Description: The patient is awake and drowsy during the recording.  During maximal wakefulness, there is a symmetric, medium voltage 10 Hz posterior dominant rhythm that attenuates with eye opening.  The record is symmetric.  During drowsiness, there is an increase in theta slowing of the background.  Stage 2 sleep was not seen.  Hyperventilation and photic stimulation did not elicit any abnormalities.  Patient exhibited multiple body jerks with associated muscle artifact but no electrographic correlate.  There were no epileptiform discharges or electrographic seizures seen.    EKG lead was unremarkable.  Impression: This awake and drowsy EEG is normal.    Clinical Correlation: A normal EEG does not exclude a clinical diagnosis of epilepsy.  If further clinical questions remain, prolonged EEG may be helpful.  Clinical correlation is advised.   Shon MilletAdam Jaffe, DO

## 2018-08-20 NOTE — Telephone Encounter (Signed)
Dr. Clent RidgesFry, please advise. Thanks    Dr. Clent RidgesFry, I wanted to ask about another mild medication. I spoke to my therapist and learned that gabbapentin is a drug that is commonly used *off-label* to treat anxiety/OCD. There seems to be no side effects and if it is not effective, one can withdraw/taper down easily. My therapist and I myself are very eager to look into this so I hope I can speak to you about it. I really feel like this will help me and I may be able to stop taking Ativan in the long-term by using gabbapentin.   Thanks a lot for reading,  [employer]odie Koons

## 2018-08-21 ENCOUNTER — Telehealth: Payer: Self-pay

## 2018-08-21 NOTE — Telephone Encounter (Signed)
-----   Message from Drema DallasAdam R Jaffe, DO sent at 08/20/2018  9:45 AM EDT ----- EEG normal

## 2018-08-21 NOTE — Telephone Encounter (Signed)
Called and LM with Pt's father for return call concerning test results

## 2018-08-22 ENCOUNTER — Encounter: Payer: Self-pay | Admitting: Family Medicine

## 2018-08-22 NOTE — Telephone Encounter (Signed)
Dr. Clent RidgesFry, I wanted to reach out and ask if it's okay for me to take a 0.5 Ativan at night (as prescribed) along with my Zaleplon sleeping medication? It seems like these two would interact with one another but I wanted to be on the safe side and reach out and ask.     Thanks a lot!    David [name]    Dr. Clent RidgesFry please advise. Thanks

## 2018-08-22 NOTE — Telephone Encounter (Signed)
Dr. Fry please advise on refill. Thanks  

## 2018-08-23 MED ORDER — GABAPENTIN 100 MG PO CAPS: 100.0000 mg | ORAL_CAPSULE | Freq: Two times a day (BID) | ORAL | 2 refills | Status: DC

## 2018-08-23 NOTE — Telephone Encounter (Signed)
Thank you so much! For questions: I've read that gabapentin is safe and 'may or may not' work, but either way there shouldn't be withdrawals like SSRI's or Benzo's if I decide to stop taking it. Am I correct?   Dr. Clent RidgesFry please advise. Thanks

## 2018-08-24 ENCOUNTER — Encounter: Payer: Self-pay | Admitting: Family Medicine

## 2018-08-24 NOTE — Telephone Encounter (Signed)
Dr. Fry please advise. Thanks  

## 2018-08-24 NOTE — Telephone Encounter (Signed)
I would NOT take Ativan and Zaleplon together. Take one or the other. He is correct that there is usually not a withdrawal syndrome from stopping Gabapentin  

## 2018-08-27 ENCOUNTER — Telehealth: Payer: Self-pay

## 2018-08-27 ENCOUNTER — Encounter: Payer: Self-pay | Admitting: Family Medicine

## 2018-08-27 NOTE — Telephone Encounter (Signed)
Dr. Fry please advise. Thanks  

## 2018-08-27 NOTE — Telephone Encounter (Signed)
-----   Message from Drema DallasAdam R Jaffe, DO sent at 08/20/2018  9:45 AM EDT ----- EEG normal

## 2018-08-27 NOTE — Telephone Encounter (Signed)
Called and spoke with Pt's father, Pt has seen results on my chart

## 2018-08-28 NOTE — Telephone Encounter (Signed)
We do not use antibiotics for stomach viruses because they are viruses. Drink fluids and it should resolve on its own.

## 2018-08-28 NOTE — Telephone Encounter (Signed)
I would NOT take Ativan and Zaleplon together. Take one or the other. He is correct that there is usually not a withdrawal syndrome from stopping Gabapentin

## 2018-08-28 NOTE — Telephone Encounter (Signed)
I agree. Increase the Buspar to 10 mg bid. Call in #60 with 2 rf

## 2018-08-29 ENCOUNTER — Encounter: Payer: Self-pay | Admitting: Family Medicine

## 2018-08-29 MED ORDER — BUSPIRONE HCL 10 MG PO TABS: 10.0000 mg | ORAL_TABLET | Freq: Two times a day (BID) | ORAL | 2 refills | Status: DC

## 2018-08-30 ENCOUNTER — Encounter: Payer: Self-pay | Admitting: Family Medicine

## 2018-08-31 NOTE — Telephone Encounter (Signed)
Dr. Fry please advise. Thanks  

## 2018-09-04 MED ORDER — CLONIDINE HCL ER 0.1 MG PO TB12: 0.1000 mg | ORAL_TABLET | Freq: Two times a day (BID) | ORAL | 0 refills | Status: DC

## 2018-09-04 NOTE — Telephone Encounter (Signed)
He can certainly try this. Call in Kapvay 0.1 mg to take bid, #60 with no rf.

## 2018-09-06 DIAGNOSIS — F429 Obsessive-compulsive disorder, unspecified: Secondary | ICD-10-CM | POA: Diagnosis not present

## 2018-09-06 DIAGNOSIS — F312 Bipolar disorder, current episode manic severe with psychotic features: Secondary | ICD-10-CM | POA: Diagnosis not present

## 2018-09-10 ENCOUNTER — Encounter: Payer: Self-pay | Admitting: Family Medicine

## 2018-09-10 NOTE — Telephone Encounter (Signed)
Dr. Fry please advise. Thanks  

## 2018-09-11 NOTE — Telephone Encounter (Signed)
It is simpler to move up to the  10 mg dose. Call in Sonata 10 mg qhs #30 with 2 rf

## 2018-09-12 MED ORDER — ZALEPLON 10 MG PO CAPS: 10.0000 mg | ORAL_CAPSULE | Freq: Every evening | ORAL | 2 refills | Status: DC | PRN

## 2018-09-13 ENCOUNTER — Encounter: Payer: Self-pay | Admitting: Family Medicine

## 2018-09-13 NOTE — Telephone Encounter (Signed)
Dr. Fry please advise. Thanks  

## 2018-09-14 MED ORDER — BUSPIRONE HCL 10 MG PO TABS: 10.0000 mg | ORAL_TABLET | Freq: Three times a day (TID) | ORAL | 2 refills | Status: DC

## 2018-09-14 NOTE — Telephone Encounter (Signed)
He already has refills available for Ativan. increas the Buspar to 10 mg TID, call in #90 with no rf

## 2018-09-14 NOTE — Telephone Encounter (Signed)
Dr. Fry please advise. Thanks  

## 2018-09-19 ENCOUNTER — Encounter: Payer: Self-pay | Admitting: Family Medicine

## 2018-09-19 NOTE — Telephone Encounter (Signed)
Dr. Fry please advise. Thanks  

## 2018-09-19 NOTE — Telephone Encounter (Signed)
It's best to split up the doses, take one in the am and one in the pm

## 2018-09-24 ENCOUNTER — Encounter: Payer: Self-pay | Admitting: Family Medicine

## 2018-09-24 NOTE — Telephone Encounter (Signed)
Dr Fry please advise. thanks 

## 2018-09-24 NOTE — Telephone Encounter (Signed)
Call this in to take one tablet in the AM and to take 2 tabs in the evening, #90 with 5 rf

## 2018-09-26 ENCOUNTER — Encounter: Payer: Self-pay | Admitting: Family Medicine

## 2018-09-26 MED ORDER — LORAZEPAM 0.5 MG PO TABS: 0.5000 mg | ORAL_TABLET | Freq: Three times a day (TID) | ORAL | 0 refills | Status: DC | PRN

## 2018-09-26 NOTE — Telephone Encounter (Signed)
Dr. Fry please advise. Thanks  

## 2018-09-26 NOTE — Telephone Encounter (Signed)
Call the pharmacy to change his prescription for Lorazepam 0.5 mg to take it TID as needed, #90 with no rf

## 2018-10-01 ENCOUNTER — Ambulatory Visit: Payer: BLUE CROSS/BLUE SHIELD | Admitting: Family Medicine

## 2018-10-01 MED ORDER — CLONIDINE HCL ER 0.1 MG PO TB12: ORAL_TABLET | ORAL | 5 refills | Status: DC

## 2018-10-02 ENCOUNTER — Encounter: Payer: Self-pay | Admitting: Family Medicine

## 2018-10-02 NOTE — Telephone Encounter (Signed)
Dr Fry please advise. thanks 

## 2018-10-04 ENCOUNTER — Encounter: Payer: Self-pay | Admitting: Family Medicine

## 2018-10-05 NOTE — Telephone Encounter (Signed)
Dr Fry please advise. thanks 

## 2018-10-05 NOTE — Telephone Encounter (Signed)
NO there is no way to fill this earlier than is written for

## 2018-10-05 NOTE — Telephone Encounter (Signed)
I would keep all your medications where they are until you meet with the psychiatrist

## 2018-10-12 DIAGNOSIS — F310 Bipolar disorder, current episode hypomanic: Secondary | ICD-10-CM | POA: Diagnosis not present

## 2018-10-15 ENCOUNTER — Encounter: Payer: Self-pay | Admitting: Family Medicine

## 2018-10-16 NOTE — Telephone Encounter (Signed)
Dr. Fry please advise. Thanks  

## 2018-10-18 ENCOUNTER — Encounter: Payer: Self-pay | Admitting: Family Medicine

## 2018-10-19 NOTE — Telephone Encounter (Signed)
First of all, his psychiatrist needs to order whatever lab work they want him to have. This cannot be done here. Second he needs to ask the psychiatrist about the Kapjay since they are now in charge of all psychoactive medications.

## 2018-10-23 ENCOUNTER — Ambulatory Visit (INDEPENDENT_AMBULATORY_CARE_PROVIDER_SITE_OTHER): Payer: BLUE CROSS/BLUE SHIELD | Admitting: Family Medicine

## 2018-10-23 ENCOUNTER — Encounter: Payer: Self-pay | Admitting: Family Medicine

## 2018-10-23 VITALS — BP 120/62 | HR 90 | Temp 98.3°F | Wt 167.0 lb

## 2018-10-23 DIAGNOSIS — F319 Bipolar disorder, unspecified: Secondary | ICD-10-CM | POA: Insufficient documentation

## 2018-10-23 DIAGNOSIS — T887XXA Unspecified adverse effect of drug or medicament, initial encounter: Secondary | ICD-10-CM | POA: Diagnosis not present

## 2018-10-23 DIAGNOSIS — F310 Bipolar disorder, current episode hypomanic: Secondary | ICD-10-CM | POA: Diagnosis not present

## 2018-10-23 DIAGNOSIS — Z1159 Encounter for screening for other viral diseases: Secondary | ICD-10-CM | POA: Diagnosis not present

## 2018-10-23 DIAGNOSIS — Z209 Contact with and (suspected) exposure to unspecified communicable disease: Secondary | ICD-10-CM | POA: Diagnosis not present

## 2018-10-23 LAB — CBC WITH DIFFERENTIAL/PLATELET
BASOS ABS: 0.1 10*3/uL (ref 0.0–0.1)
Basophils Relative: 1 % (ref 0.0–3.0)
EOS ABS: 0.2 10*3/uL (ref 0.0–0.7)
Eosinophils Relative: 3.2 % (ref 0.0–5.0)
HCT: 43.1 % (ref 39.0–52.0)
Hemoglobin: 14.6 g/dL (ref 13.0–17.0)
LYMPHS ABS: 2.2 10*3/uL (ref 0.7–4.0)
Lymphocytes Relative: 30.1 % (ref 12.0–46.0)
MCHC: 34 g/dL (ref 30.0–36.0)
MCV: 89.2 fl (ref 78.0–100.0)
MONO ABS: 0.5 10*3/uL (ref 0.1–1.0)
Monocytes Relative: 6.8 % (ref 3.0–12.0)
NEUTROS ABS: 4.3 10*3/uL (ref 1.4–7.7)
NEUTROS PCT: 58.9 % (ref 43.0–77.0)
PLATELETS: 231 10*3/uL (ref 150.0–400.0)
RBC: 4.83 Mil/uL (ref 4.22–5.81)
RDW: 12.2 % (ref 11.5–15.5)
WBC: 7.3 10*3/uL (ref 4.0–10.5)

## 2018-10-23 LAB — HEPATIC FUNCTION PANEL
ALBUMIN: 4.8 g/dL (ref 3.5–5.2)
ALK PHOS: 59 U/L (ref 39–117)
ALT: 17 U/L (ref 0–53)
AST: 21 U/L (ref 0–37)
Bilirubin, Direct: 0.1 mg/dL (ref 0.0–0.3)
Total Bilirubin: 0.2 mg/dL (ref 0.2–1.2)
Total Protein: 7.1 g/dL (ref 6.0–8.3)

## 2018-10-23 LAB — POC URINALSYSI DIPSTICK (AUTOMATED)
BILIRUBIN UA: NEGATIVE
Glucose, UA: NEGATIVE
Ketones, UA: NEGATIVE
Leukocytes, UA: NEGATIVE
NITRITE UA: NEGATIVE
PH UA: 7 (ref 5.0–8.0)
PROTEIN UA: NEGATIVE
RBC UA: NEGATIVE
Spec Grav, UA: 1.015 (ref 1.010–1.025)
UROBILINOGEN UA: 0.2 U/dL

## 2018-10-23 LAB — BASIC METABOLIC PANEL
BUN: 15 mg/dL (ref 6–23)
CO2: 32 meq/L (ref 19–32)
Calcium: 9.4 mg/dL (ref 8.4–10.5)
Chloride: 101 mEq/L (ref 96–112)
Creatinine, Ser: 1.18 mg/dL (ref 0.40–1.50)
GFR: 79.49 mL/min (ref >60.00)
GLUCOSE: 117 mg/dL — AB (ref 70–99)
POTASSIUM: 4 meq/L (ref 3.5–5.1)
SODIUM: 142 meq/L (ref 135–145)

## 2018-10-23 LAB — TSH: TSH: 0.78 u[IU]/mL (ref 0.35–4.50)

## 2018-10-23 NOTE — Telephone Encounter (Signed)
As I have said, ALL psychiatric medications need to come from his psychiatrist

## 2018-10-23 NOTE — Telephone Encounter (Signed)
Once again, he needs to ask his psychiatrist for ALL psychiatric medication questions

## 2018-10-23 NOTE — Progress Notes (Signed)
   Subjective:    Patient ID: David [name], male    DOB: 07/09/1991, 27 y.o.   MRN: [id number]  HPI Here asking for labs to check for side effects of medications. He has been seeing Dr. Thedore MinsMojeed Akintayo for psychiatric care and his current medications are working well. He has been diagnosed with bipolar disorder and he had been delusional at one point. Since starting on Depakote this has greatly improved.    Review of Systems  Constitutional: Negative.   Respiratory: Negative.   Cardiovascular: Negative.   Gastrointestinal: Negative.   Neurological: Negative.   Psychiatric/Behavioral: Positive for decreased concentration. Negative for dysphoric mood. The patient is nervous/anxious.        Objective:   Physical Exam  Constitutional: He is oriented to person, place, and time. He appears well-developed and well-nourished.  Cardiovascular: Normal rate, regular rhythm, normal heart sounds and intact distal pulses.  Pulmonary/Chest: Effort normal and breath sounds normal.  Neurological: He is alert and oriented to person, place, and time.  Psychiatric: He has a normal mood and affect. His behavior is normal. Thought content normal.          Assessment & Plan:  Newly diagnosed bipolar disorder. We will send him for baseline labs. Gershon CraneStephen Fry, MD

## 2018-10-24 LAB — HEPATITIS C ANTIBODY
HEP C AB: NONREACTIVE
SIGNAL TO CUT-OFF: 0.01 (ref <1.00)

## 2018-10-26 ENCOUNTER — Encounter: Payer: Self-pay | Admitting: *Deleted

## 2018-11-05 ENCOUNTER — Other Ambulatory Visit: Payer: Self-pay | Admitting: Family Medicine

## 2018-11-09 DIAGNOSIS — F310 Bipolar disorder, current episode hypomanic: Secondary | ICD-10-CM | POA: Diagnosis not present

## 2018-11-14 ENCOUNTER — Encounter: Payer: Self-pay | Admitting: Family Medicine

## 2018-11-14 NOTE — Telephone Encounter (Signed)
Dr. Fry please advise. Thanks  

## 2018-11-16 NOTE — Telephone Encounter (Signed)
Dr. Fry please advise on refill. Thanks  

## 2018-11-16 NOTE — Telephone Encounter (Signed)
NO, this needs to go to his psychiatrist

## 2018-11-16 NOTE — Telephone Encounter (Signed)
This must be done through his psychiatrist

## 2018-11-19 ENCOUNTER — Other Ambulatory Visit: Payer: Self-pay | Admitting: Family Medicine

## 2018-11-20 NOTE — Telephone Encounter (Signed)
Dr. Fry please advise on refill. Thanks  

## 2018-12-07 DIAGNOSIS — F310 Bipolar disorder, current episode hypomanic: Secondary | ICD-10-CM | POA: Diagnosis not present

## 2018-12-13 ENCOUNTER — Encounter: Payer: Self-pay | Admitting: Psychiatry

## 2018-12-13 ENCOUNTER — Ambulatory Visit (INDEPENDENT_AMBULATORY_CARE_PROVIDER_SITE_OTHER): Payer: BLUE CROSS/BLUE SHIELD | Admitting: Psychiatry

## 2018-12-13 VITALS — BP 113/62 | HR 81 | Ht 69.0 in | Wt 165.0 lb

## 2018-12-13 DIAGNOSIS — F422 Mixed obsessional thoughts and acts: Secondary | ICD-10-CM

## 2018-12-13 DIAGNOSIS — F319 Bipolar disorder, unspecified: Secondary | ICD-10-CM | POA: Diagnosis not present

## 2018-12-13 DIAGNOSIS — F5105 Insomnia due to other mental disorder: Secondary | ICD-10-CM | POA: Diagnosis not present

## 2018-12-13 DIAGNOSIS — F902 Attention-deficit hyperactivity disorder, combined type: Secondary | ICD-10-CM

## 2018-12-13 DIAGNOSIS — F99 Mental disorder, not otherwise specified: Secondary | ICD-10-CM

## 2018-12-13 DIAGNOSIS — Z79899 Other long term (current) drug therapy: Secondary | ICD-10-CM | POA: Diagnosis not present

## 2018-12-13 MED ORDER — TRAZODONE HCL 50 MG PO TABS: ORAL_TABLET | ORAL | 1 refills | Status: DC

## 2018-12-13 MED ORDER — DIVALPROEX SODIUM ER 500 MG PO TB24: 1000.0000 mg | ORAL_TABLET | Freq: Every day | ORAL | 1 refills | Status: DC

## 2018-12-13 MED ORDER — LORAZEPAM 0.5 MG PO TABS
0.5000 mg | ORAL_TABLET | Freq: Every day | ORAL | 1 refills | Status: AC | PRN
Start: 2018-12-13 — End: 2019-01-12

## 2018-12-13 MED ORDER — GUANFACINE HCL ER 2 MG PO TB24: 2.0000 mg | ORAL_TABLET | Freq: Every day | ORAL | 1 refills | Status: DC
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
## 2018-12-27 ENCOUNTER — Encounter: Payer: Self-pay | Admitting: Family Medicine

## 2018-12-27 ENCOUNTER — Ambulatory Visit (INDEPENDENT_AMBULATORY_CARE_PROVIDER_SITE_OTHER): Payer: BLUE CROSS/BLUE SHIELD | Admitting: Family Medicine

## 2018-12-27 VITALS — BP 112/78 | HR 94 | Temp 98.4°F | Wt 171.4 lb

## 2018-12-27 DIAGNOSIS — F310 Bipolar disorder, current episode hypomanic: Secondary | ICD-10-CM | POA: Diagnosis not present

## 2018-12-27 DIAGNOSIS — F429 Obsessive-compulsive disorder, unspecified: Secondary | ICD-10-CM

## 2018-12-27 LAB — HEPATIC FUNCTION PANEL
ALT: 20 U/L (ref 0–53)
AST: 23 U/L (ref 0–37)
Albumin: 4.9 g/dL (ref 3.5–5.2)
Alkaline Phosphatase: 47 U/L (ref 39–117)
BILIRUBIN DIRECT: 0.1 mg/dL (ref 0.0–0.3)
BILIRUBIN TOTAL: 0.3 mg/dL (ref 0.2–1.2)
Total Protein: 7.4 g/dL (ref 6.0–8.3)

## 2018-12-27 LAB — BASIC METABOLIC PANEL
BUN: 12 mg/dL (ref 6–23)
CHLORIDE: 99 meq/L (ref 96–112)
CO2: 34 mEq/L — ABNORMAL HIGH (ref 19–32)
Calcium: 9.8 mg/dL (ref 8.4–10.5)
Creatinine, Ser: 0.98 mg/dL (ref 0.40–1.50)
GFR: 92.54 mL/min (ref >60.00)
Glucose, Bld: 64 mg/dL — ABNORMAL LOW (ref 70–99)
POTASSIUM: 4.5 meq/L (ref 3.5–5.1)
SODIUM: 141 meq/L (ref 135–145)

## 2018-12-27 LAB — CBC WITH DIFFERENTIAL/PLATELET
BASOS PCT: 1.2 % (ref 0.0–3.0)
Basophils Absolute: 0.1 10*3/uL (ref 0.0–0.1)
EOS PCT: 3.3 % (ref 0.0–5.0)
Eosinophils Absolute: 0.2 10*3/uL (ref 0.0–0.7)
HCT: 44.7 % (ref 39.0–52.0)
HEMOGLOBIN: 15.7 g/dL (ref 13.0–17.0)
LYMPHS ABS: 1.8 10*3/uL (ref 0.7–4.0)
Lymphocytes Relative: 24.4 % (ref 12.0–46.0)
MCHC: 35.1 g/dL (ref 30.0–36.0)
MCV: 89.5 fl (ref 78.0–100.0)
MONOS PCT: 11.4 % (ref 3.0–12.0)
Monocytes Absolute: 0.9 10*3/uL (ref 0.1–1.0)
NEUTROS PCT: 59.7 % (ref 43.0–77.0)
Neutro Abs: 4.5 10*3/uL (ref 1.4–7.7)
Platelets: 225 10*3/uL (ref 150.0–400.0)
RBC: 4.99 Mil/uL (ref 4.22–5.81)
RDW: 13.4 % (ref 11.5–15.5)
WBC: 7.5 10*3/uL (ref 4.0–10.5)

## 2018-12-27 LAB — TSH: TSH: 0.38 u[IU]/mL (ref 0.35–4.50)

## 2018-12-27 MED ORDER — BUSPIRONE HCL 15 MG PO TABS: 15.0000 mg | ORAL_TABLET | Freq: Three times a day (TID) | ORAL | 0 refills | Status: DC

## 2018-12-27 NOTE — Progress Notes (Signed)
   Subjective:    Patient ID: David [name], male    DOB: 01-Dec-1991, 28 y.o.   MRN: [id number]  HPI Here to follow up on bipolar disorder and OCD. He is doing much better on his current regime. He sees Corie Chiquito, a NP at [facility]. He says his depression and anxiety are well controlled, but he still struggles with OCD a bit. He is here also to have blood levels drawn.    Review of Systems  Constitutional: Negative.   Respiratory: Negative.   Cardiovascular: Negative.   Neurological: Negative.   Psychiatric/Behavioral: Positive for agitation. Negative for confusion, decreased concentration, dysphoric mood and sleep disturbance. The patient is nervous/anxious.        Objective:   Physical Exam Constitutional:      Appearance: Normal appearance.  Cardiovascular:     Rate and Rhythm: Normal rate and regular rhythm.     Pulses: Normal pulses.     Heart sounds: Normal heart sounds.  Pulmonary:     Effort: Pulmonary effort is normal.     Breath sounds: Normal breath sounds.  Neurological:     General: No focal deficit present.     Mental Status: He is alert and oriented to person, place, and time.  Psychiatric:        Mood and Affect: Mood normal.        Behavior: Behavior normal.        Thought Content: Thought content normal.        Judgment: Judgment normal.           Assessment & Plan:  Bipolar disorder and OCD. We will get labs today including a valproic acid level.  Gershon Crane, MD

## 2018-12-28 ENCOUNTER — Encounter: Payer: Self-pay | Admitting: *Deleted

## 2018-12-28 LAB — VALPROIC ACID LEVEL: Valproic Acid Lvl: 86.5 mg/L (ref 50.0–100.0)

## 2019-01-01 ENCOUNTER — Encounter: Payer: Self-pay | Admitting: Family Medicine

## 2019-01-02 ENCOUNTER — Ambulatory Visit: Payer: BLUE CROSS/BLUE SHIELD | Admitting: Family Medicine

## 2019-01-10 ENCOUNTER — Telehealth: Payer: Self-pay | Admitting: Psychiatry

## 2019-01-10 ENCOUNTER — Ambulatory Visit: Payer: BLUE CROSS/BLUE SHIELD | Admitting: Psychiatry

## 2019-01-10 DIAGNOSIS — F319 Bipolar disorder, unspecified: Secondary | ICD-10-CM

## 2019-01-10 DIAGNOSIS — F902 Attention-deficit hyperactivity disorder, combined type: Secondary | ICD-10-CM

## 2019-01-10 NOTE — Telephone Encounter (Signed)
Patient cancelled appt due to inclement weather. He would like to speak with you concerning the next step in meds increase or maintain as prescribed.

## 2019-01-10 NOTE — Telephone Encounter (Signed)
Left message to call back  

## 2019-01-14 NOTE — Telephone Encounter (Signed)
Pt is rescheduling follow up but asking if he could increase depakote by 250mg ? He's currently taking 1000mg , mood is ok he says but feels like he needs just a slight adjustment. Also requesting to increase his guanfacine by another 1mg  as well?   appt 01/29/2019 at 9am

## 2019-01-15 MED ORDER — DIVALPROEX SODIUM ER 250 MG PO TB24: ORAL_TABLET | ORAL | 0 refills | Status: DC

## 2019-01-15 MED ORDER — DIVALPROEX SODIUM ER 500 MG PO TB24: 1000.0000 mg | ORAL_TABLET | Freq: Every day | ORAL | 1 refills | Status: DC

## 2019-01-15 MED ORDER — GUANFACINE HCL ER 3 MG PO TB24: 3.0000 mg | ORAL_TABLET | Freq: Every day | ORAL | 0 refills | Status: DC

## 2019-01-15 NOTE — Telephone Encounter (Signed)
Wanted to make sure you saw the follow up message.

## 2019-01-16 NOTE — Telephone Encounter (Signed)
Left voicemail prescriptions sent to pharmacy

## 2019-01-29 ENCOUNTER — Ambulatory Visit: Payer: BLUE CROSS/BLUE SHIELD | Admitting: Psychiatry

## 2019-03-07 ENCOUNTER — Telehealth: Payer: Self-pay | Admitting: Psychiatry

## 2019-03-07 DIAGNOSIS — F319 Bipolar disorder, unspecified: Secondary | ICD-10-CM

## 2019-03-07 MED ORDER — DIVALPROEX SODIUM ER 500 MG PO TB24: 1500.0000 mg | ORAL_TABLET | Freq: Every day | ORAL | 1 refills | Status: DC

## 2019-03-07 NOTE — Telephone Encounter (Signed)
David [name] called to request refill of his Depakote 500mg .  He also reported that he increased his dos to 1500 daily so he is taking 3 500mg  q hs.  He is not taking the 250mg  anymore.  Please send in refill as 500mg  3qhs.  Next appt is 04/01/19.  [facility] [place]

## 2019-03-13 ENCOUNTER — Other Ambulatory Visit: Payer: Self-pay

## 2019-03-13 DIAGNOSIS — F319 Bipolar disorder, unspecified: Secondary | ICD-10-CM

## 2019-03-13 MED ORDER — DIVALPROEX SODIUM ER 500 MG PO TB24: 1500.0000 mg | ORAL_TABLET | Freq: Every day | ORAL | 1 refills | Status: DC

## 2019-04-01 ENCOUNTER — Other Ambulatory Visit: Payer: Self-pay

## 2019-04-01 ENCOUNTER — Telehealth: Payer: Self-pay | Admitting: Psychiatry

## 2019-04-01 ENCOUNTER — Ambulatory Visit: Payer: BLUE CROSS/BLUE SHIELD | Admitting: Psychiatry

## 2019-04-01 NOTE — Telephone Encounter (Signed)
Staff attempted to contact patient on 03/28/2019 regarding today's telemedicine appointment and to inquire about patient's preferred method of communication and contact information for appointment.  Staff was unable to reach patient and unable to leave a message since his voicemail was not set up.  Attempted to reach patient at time of scheduled visit.  There was no answer when phone number on file was called.  No alternative numbers listed.  Unable to leave message since voicemail has not been set up.  Recommend patient be rescheduled when he contacts office.

## 2019-04-17 ENCOUNTER — Encounter: Payer: Self-pay | Admitting: Psychiatry

## 2019-04-17 ENCOUNTER — Ambulatory Visit: Payer: Self-pay | Admitting: Psychiatry

## 2019-04-17 ENCOUNTER — Telehealth: Payer: Self-pay | Admitting: Psychiatry

## 2019-04-17 ENCOUNTER — Other Ambulatory Visit: Payer: Self-pay

## 2019-04-17 NOTE — Telephone Encounter (Signed)
Error

## 2019-05-03 ENCOUNTER — Ambulatory Visit (INDEPENDENT_AMBULATORY_CARE_PROVIDER_SITE_OTHER): Payer: BLUE CROSS/BLUE SHIELD | Admitting: Psychiatry

## 2019-05-03 ENCOUNTER — Encounter: Payer: Self-pay | Admitting: Psychiatry

## 2019-05-03 ENCOUNTER — Other Ambulatory Visit: Payer: Self-pay

## 2019-05-03 DIAGNOSIS — F5105 Insomnia due to other mental disorder: Secondary | ICD-10-CM

## 2019-05-03 DIAGNOSIS — F422 Mixed obsessional thoughts and acts: Secondary | ICD-10-CM

## 2019-05-03 DIAGNOSIS — F99 Mental disorder, not otherwise specified: Secondary | ICD-10-CM

## 2019-05-03 DIAGNOSIS — F319 Bipolar disorder, unspecified: Secondary | ICD-10-CM

## 2019-05-03 MED ORDER — DIVALPROEX SODIUM ER 250 MG PO TB24: 250.0000 mg | ORAL_TABLET | Freq: Every day | ORAL | 1 refills | Status: DC

## 2019-05-03 MED ORDER — DIVALPROEX SODIUM ER 500 MG PO TB24: 1000.0000 mg | ORAL_TABLET | Freq: Every day | ORAL | 1 refills | Status: DC

## 2019-05-03 MED ORDER — BUSPIRONE HCL 30 MG PO TABS: 30.0000 mg | ORAL_TABLET | Freq: Two times a day (BID) | ORAL | 0 refills | Status: DC

## 2019-05-03 MED ORDER — TRAZODONE HCL 50 MG PO TABS: ORAL_TABLET | ORAL | 1 refills | Status: DC

## 2019-05-03 NOTE — Progress Notes (Signed)
David [name] [id number] [birth year] 28 y.o.  Virtual Visit via Telephone Note  I connected with@ on 05/03/19 at 10:30 AM EDT by telephone and verified that I am speaking with the correct person using two identifiers.   I discussed the limitations, risks, security and privacy concerns of performing an evaluation and management service by telephone and the availability of in person appointments. I also discussed with the patient that there may be a patient responsible charge related to this service. The patient expressed understanding and agreed to proceed.   I discussed the assessment and treatment plan with the patient. The patient was provided an opportunity to ask questions and all were answered. The patient agreed with the plan and demonstrated an understanding of the instructions.   The patient was advised to call back or seek an in-person evaluation if the symptoms worsen or if the condition fails to improve as anticipated.  I provided 30 minutes of non-face-to-face time during this encounter.  The patient was located at home.  The provider was located at [facility].   Corie Chiquito, PMHNP   Subjective:   Patient ID:  David [name] is a 28 y.o. (DOB 10-13-1991) male.  Chief Complaint:  Chief Complaint  Patient presents with  . Anxiety  . Depression  . Other    Mood lability    HPI David [name] presents for follow-up of mood and anxiety. Depakote ER has been increased from 1250 mg. He then increased Depakote ER to 1500 mg po QHS. He reports that he felt "blah" and depressed on 1500 mg QHS. He reports that he then decreased dose to 1250 mg and then 1000 mg po QHS. Reports that he feels better on 1000 mg po QHS dose and others are commenting that he looks better. He reports occasional mild depression. He reports that he rates depression as a 5 with 10= most depressed mood imaginable. He reports minimal irritability. Denies any recent manic s/s. He reports that he will often  stay up late and then has to get up early. He reports that some days he will sleep about 6 hours and other times will sleep more. He reports that appetite has been ok and will occ skip meals. Reports that his energy has been improving. Motivation remains low at times. Reports that he is exercising 3 times a week. He reports that at times he feels as if he is "in a cloud." Reports occ intrusive suicidal thoughts. Denies suicidal intent or plan.   "I'm still experiencing break through anxiety." Reports that he will "act out" certain scenarios at times, both mentally and physically. He reports that he continues to have intrusive thoughts. He reports frequent worry. Reports some panic s/s at times with SOB. Reports that OCD s/s have improved. Reports that he notices Buspar helps with bruxism.  Reports that he noticed he was talking to himself more on 1500 mg po QHS and this has improved with decreasing dose to 1000 mg po QHS. Reports that he will talk out loud, such as when he is typing things. Reports that he will also say things out loud that are inappropriate. Reports that talking out loud feels "automatic" instead of a compulsion.  Denies any other impulsive behavior. He reports that he had one day where he had "super-high energy." Denies any periods of decreased need for sleep. Denies impulsive spending. Reports periods of racing thoughts and this tends to coincide with racing thoughts.   Reports "micro-delusions"/ intrusive thoughts such as thinking that people  are talking about him and do not like him. Reports that thoughts that others could be working against him "are a 5/10."   Has been on furlough and is about to go on severance. "I feel like the future is bright."  Has not seen therapist recently.   Reports that he has been using Kratom.   Past Psychiatric Meds: Intuniv- Reports that he recently started it and it seems to be effective. Clonidine- Reports that it was "ok." Strattera- took in  childhood with Citalopram and had mixed s/s. Citalopram- took in childhood with Strattera and had mixed s/s.  Buspar- Effective.  Depakote- Effective for mood and anxiety. Started around early December.  Ativan Sonata- Effective Trazodone- Effective for insomnia.  Guanfacine- Caused anger  Review of Systems:  Review of Systems  Musculoskeletal: Negative for gait problem.  Neurological: Positive for tremors.       Reports that tremor is not as severe as it was in the past.   Psychiatric/Behavioral:       Please refer to HPI    Medications: I have reviewed the patient's current medications.  Current Outpatient Medications  Medication Sig Dispense Refill  . traZODone (DESYREL) 50 MG tablet Take 1-2 tabs po QHS prn insomnia 180 tablet 1  . busPIRone (BUSPAR) 30 MG tablet Take 1 tablet (30 mg total) by mouth 2 (two) times daily. 180 tablet 0  . divalproex (DEPAKOTE ER) 250 MG 24 hr tablet Take 1 tablet (250 mg total) by mouth daily for 30 days. Take with Depakote ER 500 mg tabs to equal total dose of 1250 mg 30 tablet 1  . divalproex (DEPAKOTE ER) 500 MG 24 hr tablet Take 2 tablets (1,000 mg total) by mouth at bedtime for 30 days. 60 tablet 1   No current facility-administered medications for this visit.     Medication Side Effects: Other: Questions if this is causing him to say things out loud.   Allergies: No Known Allergies  Past Medical History:  Diagnosis Date  . Allergy   . Bipolar disorder (HCC)    sees Corie ChiquitoJessica Laquilla Dault NP at [facility]   . Headache     Family History  Problem Relation Age of Onset  . Fibromyalgia Brother   . Drug abuse Brother   . Depression Brother   . Bipolar disorder Mother   . Personality disorder Mother   . Anxiety disorder Father   . Alcohol abuse Father   . Personality disorder Brother     Social History   Socioeconomic History  . Marital status: Single    Spouse name: Not on file  . Number of children: Not on file  . Years of  education: Not on file  . Highest education level: Associate degree: academic program  Occupational History  . Occupation: Primary school teacherT    Employer: [employer]  Social Needs  . Financial resource strain: Not on file  . Food insecurity:    Worry: Not on file    Inability: Not on file  . Transportation needs:    Medical: Not on file    Non-medical: Not on file  Tobacco Use  . Smoking status: Never Smoker  . Smokeless tobacco: Never Used  Substance and Sexual Activity  . Alcohol use: Never    Frequency: Never  . Drug use: Yes    Types: Other-see comments    Comment: Using Kratom  . Sexual activity: Not on file  Lifestyle  . Physical activity:    Days per week:  Not on file    Minutes per session: Not on file  . Stress: Not on file  Relationships  . Social connections:    Talks on phone: Not on file    Gets together: Not on file    Attends religious service: Not on file    Active member of club or organization: Not on file    Attends meetings of clubs or organizations: Not on file    Relationship status: Not on file  . Intimate partner violence:    Fear of current or ex partner: Not on file    Emotionally abused: Not on file    Physically abused: Not on file    Forced sexual activity: Not on file  Other Topics Concern  . Not on file  Social History Narrative   Patient is right-handed. He lives with his parents in a single story home. He does strength training 2 x week.    Past Medical History, Surgical history, Social history, and Family history were reviewed and updated as appropriate.   Please see review of systems for further details on the patient's review from today.   Objective:   Physical Exam:  There were no vitals taken for this visit.  Physical Exam Neurological:     Mental Status: He is alert and oriented to person, place, and time.     Cranial Nerves: No dysarthria.  Psychiatric:        Attention and Perception: Attention normal.         Mood and Affect: Mood is anxious.        Speech: Speech is rapid and pressured.        Behavior: Behavior is cooperative.        Thought Content: Thought content is paranoid. Thought content is not delusional. Thought content does not include homicidal or suicidal ideation. Thought content does not include homicidal or suicidal plan.        Cognition and Memory: Cognition and memory normal.        Judgment: Judgment normal.     Lab Review:     Component Value Date/Time   NA 141 12/27/2018 1228   K 4.5 12/27/2018 1228   CL 99 12/27/2018 1228   CO2 34 (H) 12/27/2018 1228   GLUCOSE 64 (L) 12/27/2018 1228   BUN 12 12/27/2018 1228   CREATININE 0.98 12/27/2018 1228   CALCIUM 9.8 12/27/2018 1228   PROT 7.4 12/27/2018 1228   ALBUMIN 4.9 12/27/2018 1228   AST 23 12/27/2018 1228   ALT 20 12/27/2018 1228   ALKPHOS 47 12/27/2018 1228   BILITOT 0.3 12/27/2018 1228   GFRNONAA >60 06/27/2018 2148   GFRAA >60 06/27/2018 2148       Component Value Date/Time   WBC 7.5 12/27/2018 1228   RBC 4.99 12/27/2018 1228   HGB 15.7 12/27/2018 1228   HCT 44.7 12/27/2018 1228   PLT 225.0 12/27/2018 1228   MCV 89.5 12/27/2018 1228   MCH 29.8 06/27/2018 2148   MCHC 35.1 12/27/2018 1228   RDW 13.4 12/27/2018 1228   LYMPHSABS 1.8 12/27/2018 1228   MONOABS 0.9 12/27/2018 1228   EOSABS 0.2 12/27/2018 1228   BASOSABS 0.1 12/27/2018 1228    No results found for: POCLITH, LITHIUM   Lab Results  Component Value Date   VALPROATE 86.5 12/27/2018     .res Assessment: Plan:   Pt advised to decrease Kratom use since Kratom can cause psychiatric s/s and also potentially interact with psychiatric medications. Pt asks  about a benzodiazepine and discussed that a benzodiazepine would not be recommended in combination with Kratom.  Discussed that an atypical anti-psychotic, such as Latuda, may be helpful for mood s/s as well as intrusive thoughts. Pt reports that he is apprehensive about starting an  anti-psychotic.  Will increase Buspar since pt reports that this has been helpful for his anxiety. Advised pt to gradually increase dose to minimize potential side effects since he reports that he decreased his dose due to limited supply of medications.  Will increase Depakote ER to 1250 mg po QHS since pt reports that he thought his mood was more depressed at 1500 mg po QHS and mood s/s do not seem to be adequately controlled with Depakote ER 1000 mg po QHS.  Will continue Trazodone 50 mg po QHS for insomnia. Encouraged pt to resume therapy with Olga Coaster, LPC.  Pt to f/u with this provider in 4 weeks or sooner if clinically indicated.  Patient advised to contact office with any questions, adverse effects, or acute worsening in signs and symptoms.  Mixed obsessional thoughts and acts - Plan: busPIRone (BUSPAR) 30 MG tablet  Bipolar I disorder (HCC) - Plan: divalproex (DEPAKOTE ER) 500 MG 24 hr tablet, divalproex (DEPAKOTE ER) 250 MG 24 hr tablet  Insomnia due to other mental disorder - Plan: traZODone (DESYREL) 50 MG tablet  Please see After Visit Summary for patient specific instructions.  Future Appointments  Date Time Provider Department Center  05/31/2019  1:45 PM Corie Chiquito, PMHNP [facility] None    No orders of the defined types were placed in this encounter.     -------------------------------

## 2019-05-31 ENCOUNTER — Ambulatory Visit: Payer: Self-pay | Admitting: Psychiatry

## 2019-05-31 ENCOUNTER — Telehealth: Payer: Self-pay | Admitting: Psychiatry

## 2019-05-31 NOTE — Telephone Encounter (Signed)
Attempted to contact pt for tele-visit. No answer and left message that provider would call back in several minutes. No answer on second attempt to reach pt. Left message advising that he contact office to re-schedule.

## 2019-07-09 ENCOUNTER — Telehealth: Payer: Self-pay | Admitting: Psychiatry

## 2019-07-09 NOTE — Telephone Encounter (Signed)
Pt called to ask about Depakote for bipolar. Ask J.Eulas Post to return his call [phone]

## 2019-07-09 NOTE — Telephone Encounter (Signed)
[facility] office staff called pt back to get appt set up, no show on 05/31/2019. Now scheduled for this Friday, August 7th and will discuss with Janett Billow during visit.

## 2019-07-12 ENCOUNTER — Encounter: Payer: Self-pay | Admitting: Psychiatry

## 2019-07-12 ENCOUNTER — Other Ambulatory Visit: Payer: Self-pay

## 2019-07-12 ENCOUNTER — Ambulatory Visit (INDEPENDENT_AMBULATORY_CARE_PROVIDER_SITE_OTHER): Payer: Self-pay | Admitting: Psychiatry

## 2019-07-12 DIAGNOSIS — F319 Bipolar disorder, unspecified: Secondary | ICD-10-CM

## 2019-07-12 DIAGNOSIS — F99 Mental disorder, not otherwise specified: Secondary | ICD-10-CM

## 2019-07-12 DIAGNOSIS — F422 Mixed obsessional thoughts and acts: Secondary | ICD-10-CM

## 2019-07-12 DIAGNOSIS — F5105 Insomnia due to other mental disorder: Secondary | ICD-10-CM

## 2019-07-12 MED ORDER — BUSPIRONE HCL 30 MG PO TABS: 30.0000 mg | ORAL_TABLET | Freq: Two times a day (BID) | ORAL | 0 refills | Status: DC

## 2019-07-12 MED ORDER — PROPRANOLOL HCL 10 MG PO TABS: ORAL_TABLET | ORAL | 1 refills | Status: DC

## 2019-07-12 MED ORDER — DIVALPROEX SODIUM ER 500 MG PO TB24: 1500.0000 mg | ORAL_TABLET | Freq: Every day | ORAL | 1 refills | Status: DC

## 2019-07-12 MED ORDER — TRAZODONE HCL 100 MG PO TABS: ORAL_TABLET | ORAL | 0 refills | Status: DC

## 2019-07-12 NOTE — Progress Notes (Signed)
David [name] [id number] 09/10/1991 28 y.o.  Virtual Visit via Telephone Note  I connected with pt on 07/12/19 at  1:45 PM EDT by telephone and verified that I am speaking with the correct person using two identifiers.   I discussed the limitations, risks, security and privacy concerns of performing an evaluation and management service by telephone and the availability of in person appointments. I also discussed with the patient that there may be a patient responsible charge related to this service. The patient expressed understanding and agreed to proceed.   I discussed the assessment and treatment plan with the patient. The patient was provided an opportunity to ask questions and all were answered. The patient agreed with the plan and demonstrated an understanding of the instructions.   The patient was advised to call back or seek an in-person evaluation if the symptoms worsen or if the condition fails to improve as anticipated.  I provided 30 minutes of non-face-to-face time during this encounter.  The patient was located at home.  The provider was located at [facility].   David ChiquitoJessica Nekayla Heider, PMHNP   Subjective:   Patient ID:  David [name] is a 28 y.o. (DOB 04/09/1991) male.  Chief Complaint:  Chief Complaint  Patient presents with  . Anxiety  . Depression  . Manic Behavior    HPI David [name] presents for follow-up of Bipolar D/O and anxiety. Reports that he increased Depakote ER from 1250 to 1500 mg po QHS about a week ago on his own. He reports that he noticed some manic s/s and mood lability on 1250 mg po QHS.  He reports that he initially noticed significant improvement in mood. He reports that over the last few days he has noticed some depression. He reports that he is noticing some possible affective dulling with higher dose of Depakote ER. He reports that his energy and motivation have been ok. Describes having to push himself to do things. Reports sleeping 7-8 hours a  night. Appetite has been slightly decrease. He reports adequate concentration. Denies suicidal intent or plan.   He reports that he has some social anxiety and will feel anxious before going into public places like the grocery store. Feels as if others are judging him at times. He reports that he has been having a panic attack about every 3 days. He reports that he is also having worry, rumination, and intrusive thoughts.   Reports that impulsivity has improved with medication. Denies racing thoughts.   He reports that he will continue to narrate his thoughts out loud.   He reports that he tried to decrease Kratom use and noticed worsening anxiety.   Recently moved and is looking for employment. Has not been able to see therapist recently but has apt scheduled.   Past Psychiatric Meds: Intuniv- Reports that he recently started it and it seems to be effective. Clonidine- Reports that it was "ok." Strattera- took in childhood with Citalopram and had mixed s/s. Citalopram- took in childhood with Strattera and had mixed s/s.  Buspar- Effective.  Depakote- Effective for mood and anxiety. Started around early December.  Ativan Sonata- Effective Trazodone- Effective for insomnia. Guanfacine- Caused anger Gabapentin   Review of Systems:  Review of Systems  Musculoskeletal: Negative for gait problem.  Neurological: Negative for tremors.  Psychiatric/Behavioral:       Please refer to HPI    Medications: I have reviewed the patient's current medications.  Current Outpatient Medications  Medication Sig Dispense Refill  . busPIRone (BUSPAR)  30 MG tablet Take 1 tablet (30 mg total) by mouth 2 (two) times daily. 180 tablet 0  . traZODone (DESYREL) 100 MG tablet Take 1-1.5 tabs po QHS prn insomnia 145 tablet 0  . divalproex (DEPAKOTE ER) 500 MG 24 hr tablet Take 3 tablets (1,500 mg total) by mouth at bedtime. 90 tablet 1  . propranolol (INDERAL) 10 MG tablet Take 1-2 tabs po BID prn anxiety  120 tablet 1   No current facility-administered medications for this visit.     Medication Side Effects: Other: low energy  Allergies: No Known Allergies  Past Medical History:  Diagnosis Date  . Allergy   . Bipolar disorder (HCC)    sees Janyce Ellinger NP at [facility]   . HeadacCorie [name]     Family History  Problem Relation Age of Onset  . Fibromyalgia Brother   . Drug abuse Brother   . Depression Brother   . Bipolar disorder Mother   . Personality disorder Mother   . Anxiety disorder Father   . Alcohol abuse Father   . Personality disorder Brother     Social History   Socioeconomic History  . Marital status: Single    Spouse name: Not on file  . Number of children: Not on file  . Years of education: Not on file  . Highest education level: Associate degree: academic program  Occupational History  . Occupation: Primary school teacherT    Employer: [employer]  Social Needs  . Financial resource strain: Not on file  . Food insecurity    Worry: Not on file    Inability: Not on file  . Transportation needs    Medical: Not on file    Non-medical: Not on file  Tobacco Use  . Smoking status: Never Smoker  . Smokeless tobacco: Never Used  Substance and Sexual Activity  . Alcohol use: Never    Frequency: Never  . Drug use: Yes    Types: Other-see comments    Comment: Using Kratom  . Sexual activity: Not on file  Lifestyle  . Physical activity    Days per week: Not on file    Minutes per session: Not on file  . Stress: Not on file  Relationships  . Social Musicianconnections    Talks on phone: Not on file    Gets together: Not on file    Attends religious service: Not on file    Active member of club or organization: Not on file    Attends meetings of clubs or organizations: Not on file    Relationship status: Not on file  . Intimate partner violence    Fear of current or ex partner: Not on file    Emotionally abused: Not on file    Physically abused: Not on file     Forced sexual activity: Not on file  Other Topics Concern  . Not on file  Social History Narrative   Patient is right-handed. He lives with his parents in a single story home. He does strength training 2 x week.    Past Medical History, Surgical history, Social history, and Family history were reviewed and updated as appropriate.   Please see review of systems for further details on the patient's review from today.   Objective:   Physical Exam:  There were no vitals taken for this visit.  Physical Exam Constitutional:      General: He is not in acute distress.    Appearance: He is well-developed.  Musculoskeletal:  General: No deformity.  Neurological:     Mental Status: He is alert and oriented to person, place, and time.     Coordination: Coordination normal.  Psychiatric:        Attention and Perception: Attention and perception normal. He does not perceive auditory or visual hallucinations.        Mood and Affect: Mood is anxious and depressed. Affect is not labile, blunt, angry or inappropriate.        Behavior: Behavior normal.        Thought Content: Thought content normal. Thought content does not include homicidal or suicidal ideation. Thought content does not include homicidal or suicidal plan.        Cognition and Memory: Cognition and memory normal.        Judgment: Judgment normal.     Comments: Insight intact. No delusions.  Talkative. Speech is normal in volume. Increased rate of speech     Lab Review:     Component Value Date/Time   NA 141 12/27/2018 1228   K 4.5 12/27/2018 1228   CL 99 12/27/2018 1228   CO2 34 (H) 12/27/2018 1228   GLUCOSE 64 (L) 12/27/2018 1228   BUN 12 12/27/2018 1228   CREATININE 0.98 12/27/2018 1228   CALCIUM 9.8 12/27/2018 1228   PROT 7.4 12/27/2018 1228   ALBUMIN 4.9 12/27/2018 1228   AST 23 12/27/2018 1228   ALT 20 12/27/2018 1228   ALKPHOS 47 12/27/2018 1228   BILITOT 0.3 12/27/2018 1228   GFRNONAA >60 06/27/2018  2148   GFRAA >60 06/27/2018 2148       Component Value Date/Time   WBC 7.5 12/27/2018 1228   RBC 4.99 12/27/2018 1228   HGB 15.7 12/27/2018 1228   HCT 44.7 12/27/2018 1228   PLT 225.0 12/27/2018 1228   MCV 89.5 12/27/2018 1228   MCH 29.8 06/27/2018 2148   MCHC 35.1 12/27/2018 1228   RDW 13.4 12/27/2018 1228   LYMPHSABS 1.8 12/27/2018 1228   MONOABS 0.9 12/27/2018 1228   EOSABS 0.2 12/27/2018 1228   BASOSABS 0.1 12/27/2018 1228    No results found for: POCLITH, LITHIUM   Lab Results  Component Value Date   VALPROATE 86.5 12/27/2018     .res Assessment: Plan:   Pt seen for 30 minutes and greater than 50% of visit spent counseling pt re: potential benefits, risks, and side effects of Propranolol. Discussed stopping Propranolol if dizziness or light-headedness occur.  Discussed that more time is needed to determine response to increase in Depakote ER to 1500 mg po QHS. Recommended mood charting or tracking to help determine response to treatment.   Continue Trazodone for insomnia.  Agree with plan to resume therapy. Pt to f/u with this provider in 4 weeks or sooner if clinically indicated.  Patient advised to contact office with any questions, adverse effects, or acute worsening in signs and symptoms.  Jamail was seen today for anxiety, depression and manic behavior.  Diagnoses and all orders for this visit:  Mixed obsessional thoughts and acts -     busPIRone (BUSPAR) 30 MG tablet; Take 1 tablet (30 mg total) by mouth 2 (two) times daily.  Bipolar I disorder (HCC) -     divalproex (DEPAKOTE ER) 500 MG 24 hr tablet; Take 3 tablets (1,500 mg total) by mouth at bedtime.  Insomnia due to other mental disorder -     traZODone (DESYREL) 100 MG tablet; Take 1-1.5 tabs po QHS prn insomnia  Other orders -  propranolol (INDERAL) 10 MG tablet; Take 1-2 tabs po BID prn anxiety    Please see After Visit Summary for patient specific instructions.  No future  appointments.  No orders of the defined types were placed in this encounter.     -------------------------------

## 2019-07-19 ENCOUNTER — Telehealth: Payer: Self-pay | Admitting: Psychiatry

## 2019-07-19 NOTE — Telephone Encounter (Signed)
Patient left vm 08/13 @3 :05 stating that Depakote doesn't seem to be working, would like to possibly discuss taking Lithium

## 2019-07-19 NOTE — Telephone Encounter (Signed)
Left voicemail to call back to discuss symptoms and request for change.

## 2019-07-23 ENCOUNTER — Telehealth: Payer: Self-pay | Admitting: Psychiatry

## 2019-07-23 DIAGNOSIS — F319 Bipolar disorder, unspecified: Secondary | ICD-10-CM

## 2019-07-23 NOTE — Telephone Encounter (Signed)
Pt left VM stating since his dosage increase of Depakote he feels numb. He also states he still exhibits some anxiety and depression and can't relate to people. Please advise.

## 2019-07-24 MED ORDER — DIVALPROEX SODIUM ER 250 MG PO TB24: ORAL_TABLET | ORAL | 1 refills | Status: DC

## 2019-07-24 NOTE — Telephone Encounter (Signed)
Pt. Made aware and verbalized understanding. He has scheduled an appt. With you. He is wanting to know if you can send in an RX for the 250 Mg? He said the 500 mg will be hard to cut in half.

## 2019-07-26 NOTE — Telephone Encounter (Signed)
See additional phone message for changes

## 2019-08-11 IMAGING — CT CT HEAD W/O CM
3 series · 15 of 47 positions shown, 18 images · non-contrast
Comparison: None.

CLINICAL DATA: Intractable headaches

EXAM:
CT HEAD WITHOUT CONTRAST
TECHNIQUE: Contiguous axial images were obtained from the base of the skull
through the vertex without intravenous contrast.

[Series 2: head 5.0 h37s · axial · 0.49mm/px · z∈[+1398,+1523]mm · 9 of 31 slices shown, 12 images]
[im 3/31  brain]
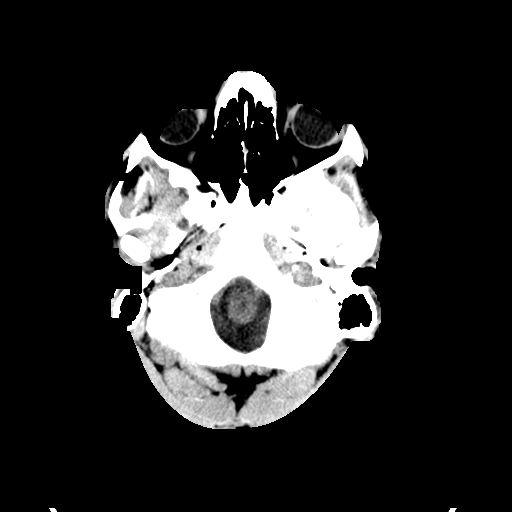
[im 3/31  bone]
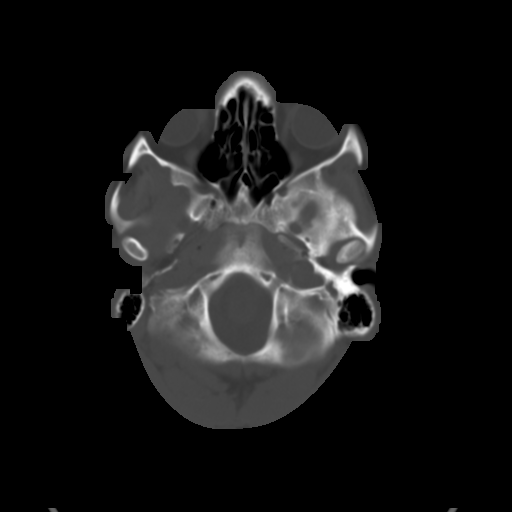
[im 6/31  brain]
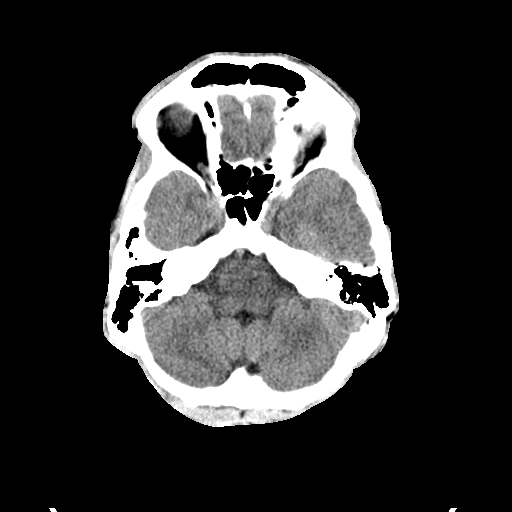
[im 9/31  brain]
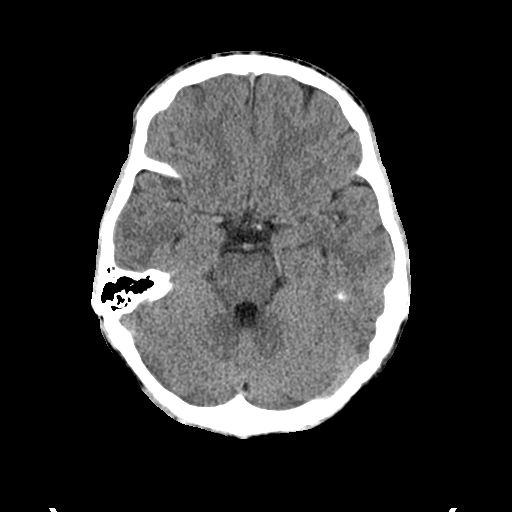
[im 12/31  brain]
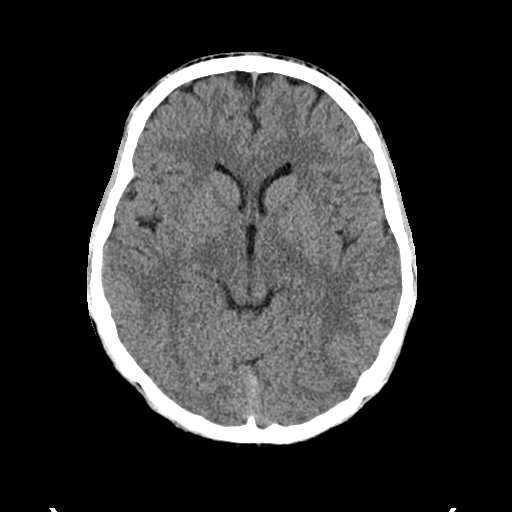
[im 16/31  brain]
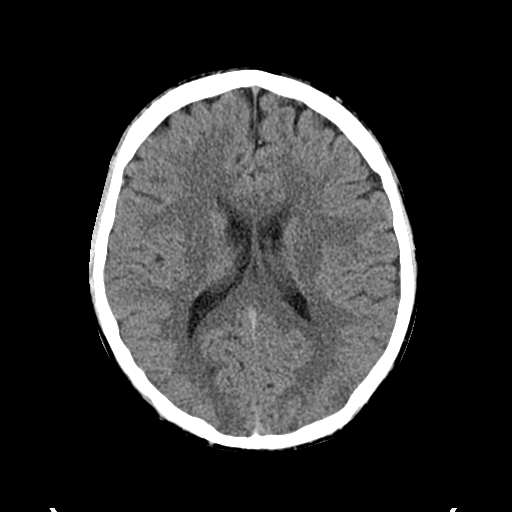
[im 16/31  bone]
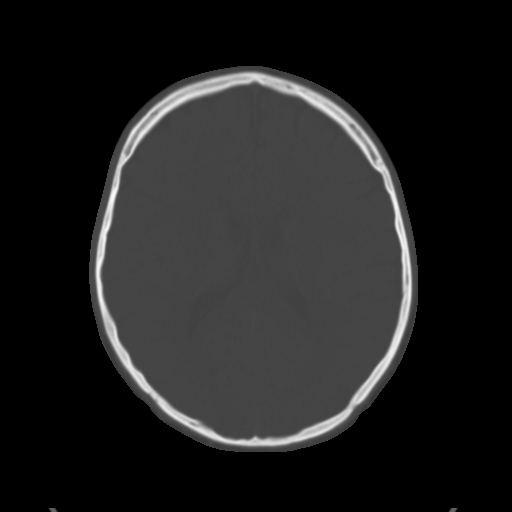
[im 19/31  brain]
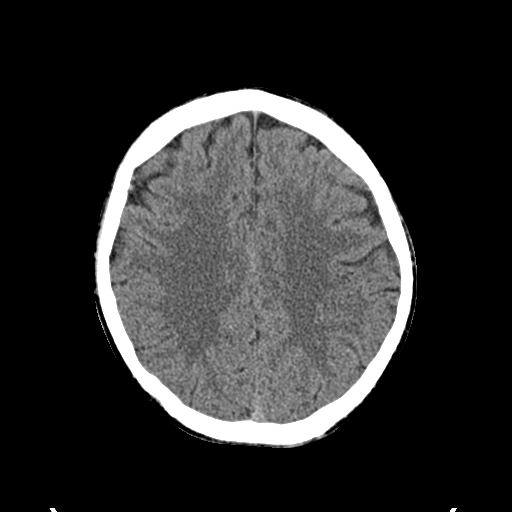
[im 22/31  brain]
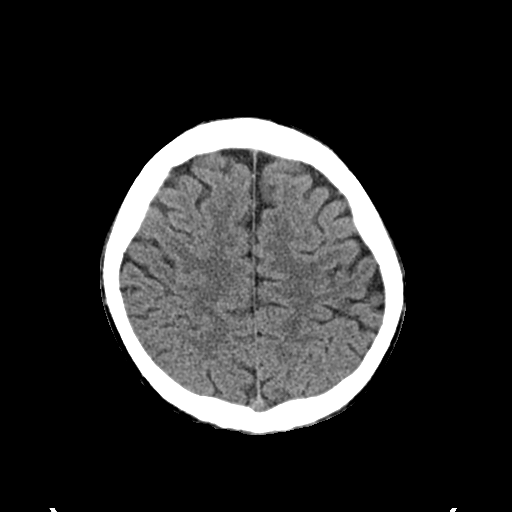
[im 25/31  brain]
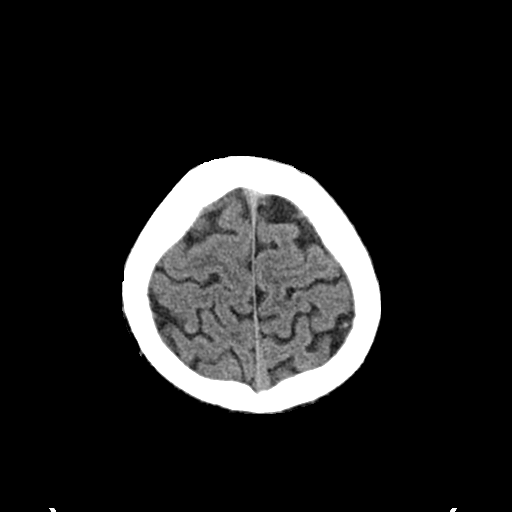
[im 28/31  brain]
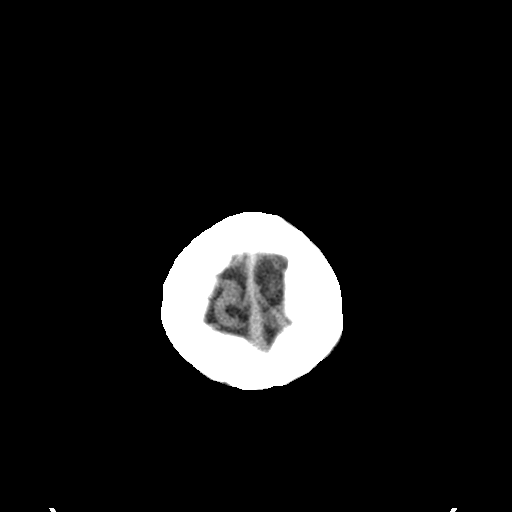
[im 28/31  bone]
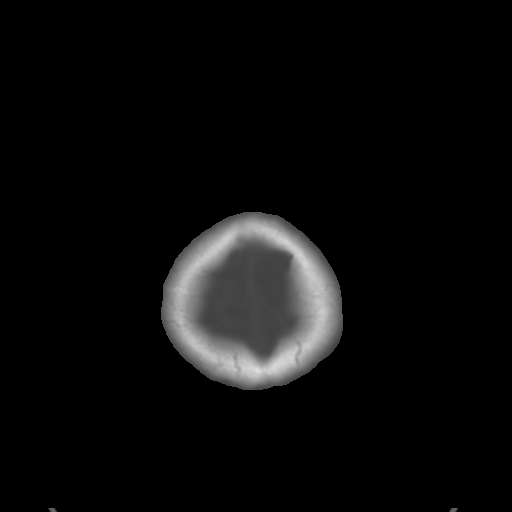

[Series 4: head 3.0 mpr cor · coronal · 0.32mm/px · 3 of 78 slices shown]
[im 26/78  brain]
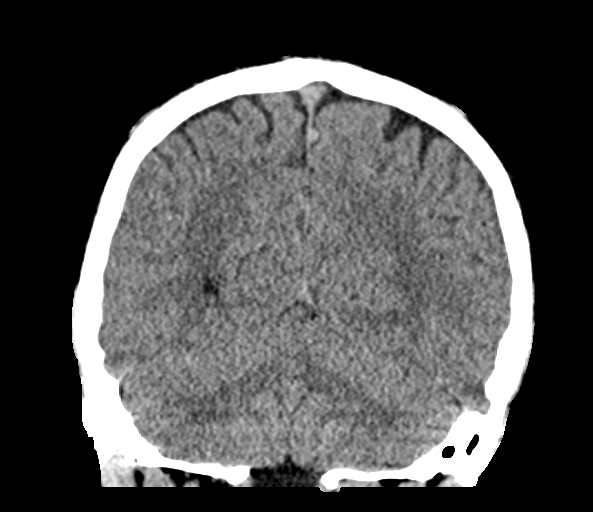
[im 35/78  brain]
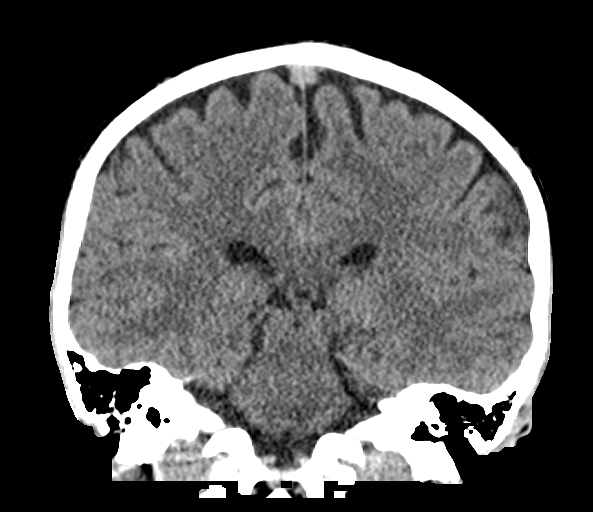
[im 43/78  brain]
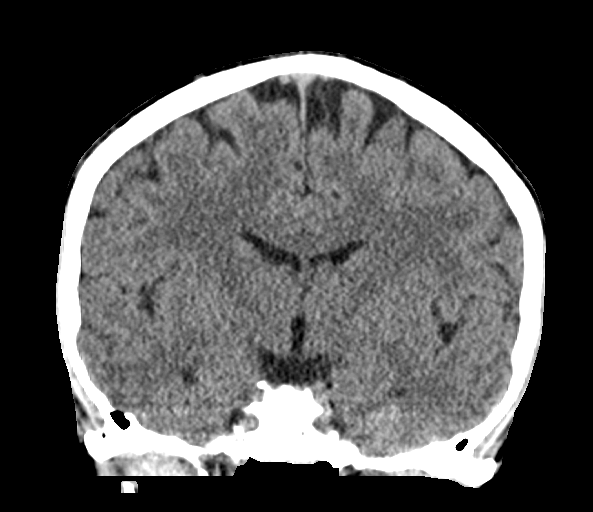

[Series 5: head 3.0 mpr sag · sagittal · 0.33mm/px · 3 of 61 slices shown]
[im 21/61  brain]
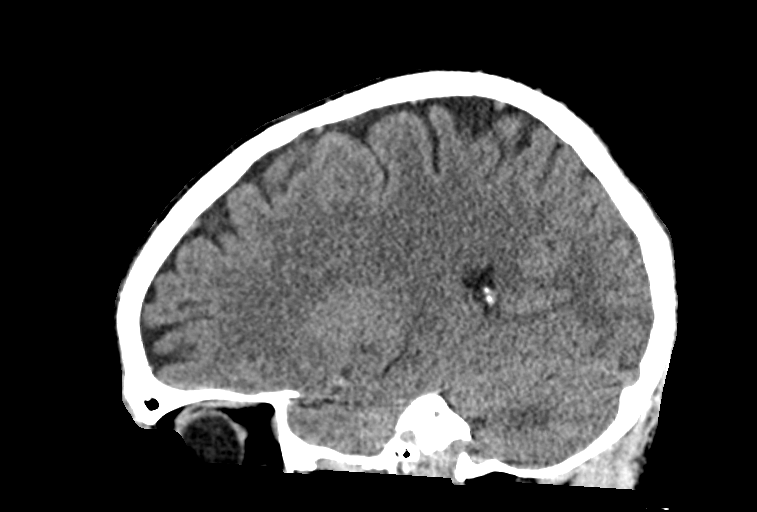
[im 31/61  brain]
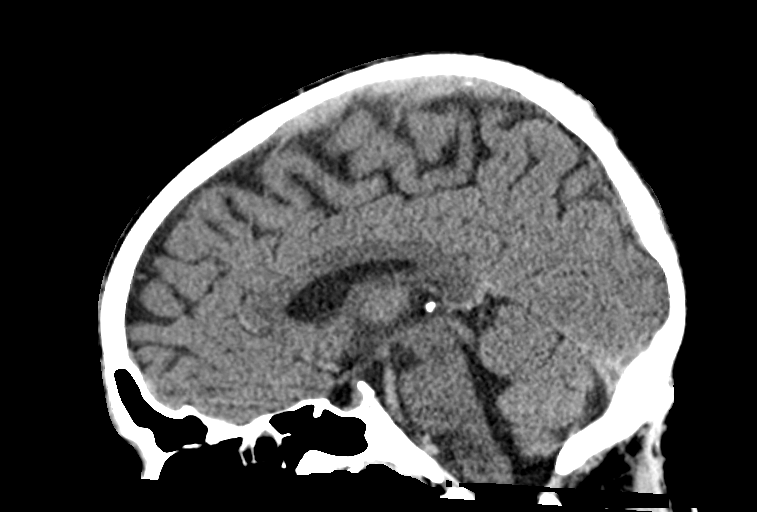
[im 41/61  brain]
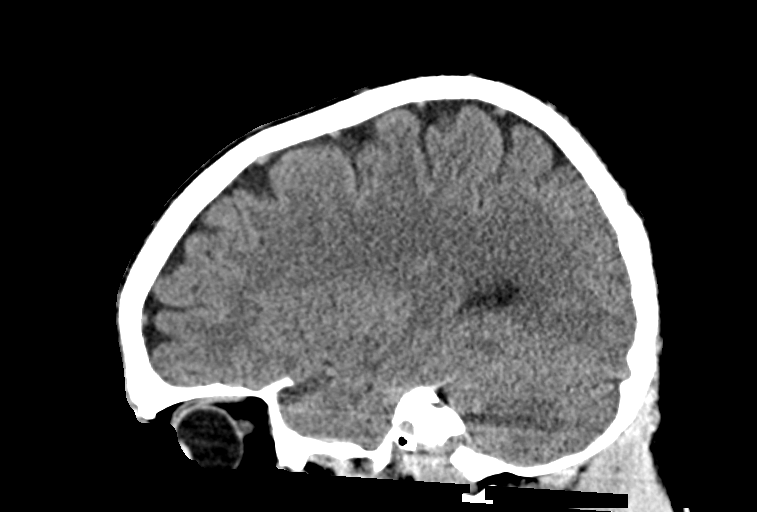

[15 of 47 positions shown; findings below may reference images not displayed]

FINDINGS: Brain: No evidence of acute infarction, hemorrhage, hydrocephalus,
extra-axial collection or mass lesion/mass effect.

Vascular: No hyperdense vessel or unexpected calcification.

Skull: Normal. Negative for fracture or focal lesion.

Sinuses/Orbits: No acute finding.

Other: None.
IMPRESSION: Normal head CT

## 2019-09-04 ENCOUNTER — Telehealth: Payer: Self-pay | Admitting: Family Medicine

## 2019-09-04 NOTE — Telephone Encounter (Signed)
Sam Kimball dropped paperwork to be completed for the patient.  Call Vashawn Ekstein to pick up forms at: [phone]   Disposition: Dr's Folder

## 2019-09-04 NOTE — Telephone Encounter (Signed)
Noted  

## 2019-09-05 DIAGNOSIS — Z0279 Encounter for issue of other medical certificate: Secondary | ICD-10-CM

## 2019-09-05 NOTE — Telephone Encounter (Signed)
Sam picked up form  Tillie Rung Made a copy and sent to scan  I made a copy to send to billing for the $29 form fee

## 2019-09-24 ENCOUNTER — Other Ambulatory Visit: Payer: Self-pay | Admitting: Psychiatry

## 2019-09-24 DIAGNOSIS — F99 Mental disorder, not otherwise specified: Secondary | ICD-10-CM

## 2019-09-24 DIAGNOSIS — F5105 Insomnia due to other mental disorder: Secondary | ICD-10-CM

## 2019-09-24 DIAGNOSIS — F422 Mixed obsessional thoughts and acts: Secondary | ICD-10-CM

## 2019-09-30 ENCOUNTER — Other Ambulatory Visit: Payer: Self-pay

## 2019-09-30 ENCOUNTER — Telehealth: Payer: Self-pay | Admitting: Psychiatry

## 2019-09-30 DIAGNOSIS — F319 Bipolar disorder, unspecified: Secondary | ICD-10-CM

## 2019-09-30 MED ORDER — DIVALPROEX SODIUM ER 500 MG PO TB24: 1000.0000 mg | ORAL_TABLET | Freq: Every day | ORAL | 1 refills | Status: DC

## 2019-09-30 MED ORDER — DIVALPROEX SODIUM ER 250 MG PO TB24: ORAL_TABLET | ORAL | 1 refills | Status: DC

## 2019-10-01 NOTE — Telephone Encounter (Signed)
error 

## 2019-10-22 ENCOUNTER — Encounter: Payer: Self-pay | Admitting: Psychiatry

## 2019-10-22 ENCOUNTER — Other Ambulatory Visit: Payer: Self-pay

## 2019-10-22 ENCOUNTER — Ambulatory Visit (INDEPENDENT_AMBULATORY_CARE_PROVIDER_SITE_OTHER): Payer: Self-pay | Admitting: Psychiatry

## 2019-10-22 DIAGNOSIS — F422 Mixed obsessional thoughts and acts: Secondary | ICD-10-CM

## 2019-10-22 DIAGNOSIS — F5105 Insomnia due to other mental disorder: Secondary | ICD-10-CM

## 2019-10-22 DIAGNOSIS — F319 Bipolar disorder, unspecified: Secondary | ICD-10-CM

## 2019-10-22 DIAGNOSIS — F99 Mental disorder, not otherwise specified: Secondary | ICD-10-CM

## 2019-10-22 MED ORDER — DIVALPROEX SODIUM ER 500 MG PO TB24: 1000.0000 mg | ORAL_TABLET | Freq: Every day | ORAL | 0 refills | Status: DC

## 2019-10-22 MED ORDER — TRAZODONE HCL 100 MG PO TABS: ORAL_TABLET | ORAL | 0 refills | Status: DC

## 2019-10-22 MED ORDER — PROPRANOLOL HCL 10 MG PO TABS: ORAL_TABLET | ORAL | 1 refills | Status: DC

## 2019-10-22 MED ORDER — DIVALPROEX SODIUM ER 250 MG PO TB24: ORAL_TABLET | ORAL | 0 refills | Status: DC

## 2019-10-22 MED ORDER — BUSPIRONE HCL 30 MG PO TABS: 30.0000 mg | ORAL_TABLET | Freq: Two times a day (BID) | ORAL | 0 refills | Status: DC

## 2019-10-22 NOTE — Progress Notes (Signed)
David [name] [id number] 12/19/1990 28 y.o.  Virtual Visit via Telephone Note  I connected with pt on 10/22/19 at  4:30 PM EST by telephone and verified that I am speaking with the correct person using two identifiers.   I discussed the limitations, risks, security and privacy concerns of performing an evaluation and management service by telephone and the availability of in person appointments. I also discussed with the patient that there may be a patient responsible charge related to this service. The patient expressed understanding and agreed to proceed.   I discussed the assessment and treatment plan with the patient. The patient was provided an opportunity to ask questions and all were answered. The patient agreed with the plan and demonstrated an understanding of the instructions.   The patient was advised to call back or seek an in-person evaluation if the symptoms worsen or if the condition fails to improve as anticipated.  I provided 25 minutes of non-face-to-face time during this encounter.  The patient was located at home.  The provider [facility].   David ChiquitoJessica Tevis Dunavan, PMHNP   Subjective:   Patient ID:  David DrapeCodie Sieben is a 28 y.o. (DOB 06/04/1991) male.  Chief Complaint:  Chief Complaint  Patient presents with  . Depression  . Anxiety    HPI Isair Clearance CootsHarper presents for follow-up of mood and anxiety. He is currently unemployed and reports that this has been a source of depression. He reports that he is periodically "waking up dreading life." He reports that he has been having persistent depression and mood improves slightly when he is around his friends. He reports that he has noticed less social anxiety. He reports that he has had one panic attack since last visit. He reports that he has some intrusive and obsessive thoughts. Stopped biting fingernails, which had been a lifelong compulsion. He reports that he is having intrusive memories and that this has  slightly increased recently. Occasional possible dissociation and flashbacks. Denies exaggerated startle response.   He reports that his energy and motivation have been low. Sleeping 7-8 hours on Trazodone, and occasionally 9-10 hours of sleep a night. Reports that he has been eating 1-2 meals daily due to sleeping late. Describes concentration is "normal." Reports vague suicidal thoughts at times. Denies any SI in the last week.   He denies any recent manic s/s. Denies impulsivity. Has been managing money responsibly.  He reports that he continues to have a frequent internal dialogue and is audibly narrating what he is doing. Denies AH or VH. Denies paranoia.  He reports that he has been avoiding caffeine.   Has not been able to see therapist with lapse in insurance. Reports good social support.   Past Psychiatric Meds: Intuniv- Reports that he recently started it and it seems to be effective. Clonidine- Reports that it was "ok." Strattera- took in childhood with Citalopram and had mixed s/s. Citalopram- took in childhood with Strattera and had mixed s/s.  Buspar-Effective.  Depakote- Effective for mood and anxiety. Started around early December. He questions if he felt "depersonalized" at 1500 mg po QHS Ativan Sonata- Effective Trazodone- Effective for insomnia. Guanfacine- Caused anger Gabapentin  Review of Systems:  Review of Systems  Musculoskeletal: Negative for gait problem.  Neurological:       Reports rare hand tremor and less frequent HA's.  Psychiatric/Behavioral:       Please refer to HPI    Medications: I have reviewed the patient's current medications.  Current Outpatient Medications  Medication Sig Dispense Refill  . busPIRone (BUSPAR) 30 MG tablet Take 1 tablet (30 mg total) by mouth 2 (two) times daily. 180 tablet 0  . divalproex (DEPAKOTE ER) 250 MG 24 hr tablet Take 1 tab po QHS with two 500 mg tabs to equal total dose of 1250 mg 90 tablet 0  . divalproex  (DEPAKOTE ER) 500 MG 24 hr tablet Take 2 tablets (1,000 mg total) by mouth at bedtime. Take with 250 mg tab to equal 1250 mg QHS 180 tablet 0  . propranolol (INDERAL) 10 MG tablet Take 1-2 tabs po BID prn anxiety 120 tablet 1  . traZODone (DESYREL) 100 MG tablet TAKE 1 TO 1 & 1/2 (ONE TO ONE & ONE-HALF) TABLETS BY MOUTH EVERY DAY AT BEDTIME AS NEEDED FOR INSOMNIA 145 tablet 0   No current facility-administered medications for this visit.     Medication Side Effects: None  Allergies: No Known Allergies  Past Medical History:  Diagnosis Date  . Allergy   . Bipolar disorder (HCC)    sees David Chiquito NP at [facility]   . Headache     Family History  Problem Relation Age of Onset  . Fibromyalgia Brother   . Drug abuse Brother   . Depression Brother   . Bipolar disorder Mother   . Personality disorder Mother   . Anxiety disorder Father   . Alcohol abuse Father   . Personality disorder Brother     Social History   Socioeconomic History  . Marital status: Single    Spouse name: Not on file  . Number of children: Not on file  . Years of education: Not on file  . Highest education level: Associate degree: academic program  Occupational History  . Occupation: Primary school teacher: Airline pilot  Social Needs  . Financial resource strain: Not on file  . Food insecurity    Worry: Not on file    Inability: Not on file  . Transportation needs    Medical: Not on file    Non-medical: Not on file  Tobacco Use  . Smoking status: Never Smoker  . Smokeless tobacco: Never Used  Substance and Sexual Activity  . Alcohol use: Never    Frequency: Never  . Drug use: Yes    Types: Other-see comments    Comment: Using Kratom  . Sexual activity: Not on file  Lifestyle  . Physical activity    Days per week: Not on file    Minutes per session: Not on file  . Stress: Not on file  Relationships  . Social Musician on phone: Not on file    Gets together: Not  on file    Attends religious service: Not on file    Active member of club or organization: Not on file    Attends meetings of clubs or organizations: Not on file    Relationship status: Not on file  . Intimate partner violence    Fear of current or ex partner: Not on file    Emotionally abused: Not on file    Physically abused: Not on file    Forced sexual activity: Not on file  Other Topics Concern  . Not on file  Social History Narrative   Patient is right-handed. He lives with his parents in a single story home. He does strength training 2 x week.    Past Medical History, Surgical history, Social history, and Family history were reviewed and updated  as appropriate.   Please see review of systems for further details on the patient's review from today.   Objective:   Physical Exam:  There were no vitals taken for this visit.  Physical Exam Neurological:     Mental Status: He is alert and oriented to person, place, and time.     Cranial Nerves: No dysarthria.  Psychiatric:        Attention and Perception: Attention normal.        Mood and Affect: Mood is anxious and depressed.        Speech: Speech normal.        Behavior: Behavior is cooperative.        Thought Content: Thought content normal. Thought content is not paranoid or delusional. Thought content does not include homicidal or suicidal ideation. Thought content does not include homicidal or suicidal plan.        Cognition and Memory: Cognition and memory normal.        Judgment: Judgment normal.     Lab Review:     Component Value Date/Time   NA 141 12/27/2018 1228   K 4.5 12/27/2018 1228   CL 99 12/27/2018 1228   CO2 34 (H) 12/27/2018 1228   GLUCOSE 64 (L) 12/27/2018 1228   BUN 12 12/27/2018 1228   CREATININE 0.98 12/27/2018 1228   CALCIUM 9.8 12/27/2018 1228   PROT 7.4 12/27/2018 1228   ALBUMIN 4.9 12/27/2018 1228   AST 23 12/27/2018 1228   ALT 20 12/27/2018 1228   ALKPHOS 47 12/27/2018 1228    BILITOT 0.3 12/27/2018 1228   GFRNONAA >60 06/27/2018 2148   GFRAA >60 06/27/2018 2148       Component Value Date/Time   WBC 7.5 12/27/2018 1228   RBC 4.99 12/27/2018 1228   HGB 15.7 12/27/2018 1228   HCT 44.7 12/27/2018 1228   PLT 225.0 12/27/2018 1228   MCV 89.5 12/27/2018 1228   MCH 29.8 06/27/2018 2148   MCHC 35.1 12/27/2018 1228   RDW 13.4 12/27/2018 1228   LYMPHSABS 1.8 12/27/2018 1228   MONOABS 0.9 12/27/2018 1228   EOSABS 0.2 12/27/2018 1228   BASOSABS 0.1 12/27/2018 1228    No results found for: POCLITH, LITHIUM   Lab Results  Component Value Date   VALPROATE 86.5 12/27/2018     .res Assessment: Plan:   Patient reports that he would like to continue current medications as prescribed since he attributes to recent depression and situational anxiety to acute psychosocial stressors and unemployment. Will continue Depakote ER 1250 p.o. nightly for mood stabilization.  Discussed option of increasing to 1500 mg at bedtime in the future if mood does not improve. Encourage patient to consider using propanolol as needed for anxiety, particularly with social anxiety and taking propanolol prior to events that will likely trigger social anxiety. Continue BuSpar 30 mg twice daily for anxiety. Continue trazodone for insomnia. Patient to follow-up in 3 months or sooner if clinically indicated. Patient advised to contact office with any questions, adverse effects, or acute worsening in signs and symptoms.  Corwin was seen today for depression and anxiety.  Diagnoses and all orders for this visit:  Bipolar I disorder (HCC) -     divalproex (DEPAKOTE ER) 250 MG 24 hr tablet; Take 1 tab po QHS with two 500 mg tabs to equal total dose of 1250 mg -     divalproex (DEPAKOTE ER) 500 MG 24 hr tablet; Take 2 tablets (1,000 mg total) by mouth at bedtime. Take  with 250 mg tab to equal 1250 mg QHS  Mixed obsessional thoughts and acts -     busPIRone (BUSPAR) 30 MG tablet; Take 1 tablet (30  mg total) by mouth 2 (two) times daily. -     propranolol (INDERAL) 10 MG tablet; Take 1-2 tabs po BID prn anxiety  Insomnia due to other mental disorder -     traZODone (DESYREL) 100 MG tablet; TAKE 1 TO 1 & 1/2 (ONE TO ONE & ONE-HALF) TABLETS BY MOUTH EVERY DAY AT BEDTIME AS NEEDED FOR INSOMNIA    Please see After Visit Summary for patient specific instructions.  No future appointments.  No orders of the defined types were placed in this encounter.     -------------------------------

## 2019-12-30 ENCOUNTER — Other Ambulatory Visit: Payer: Self-pay | Admitting: Psychiatry

## 2019-12-30 DIAGNOSIS — F319 Bipolar disorder, unspecified: Secondary | ICD-10-CM

## 2019-12-30 MED ORDER — DIVALPROEX SODIUM ER 500 MG PO TB24: 1000.0000 mg | ORAL_TABLET | Freq: Every day | ORAL | 0 refills | Status: DC

## 2019-12-30 MED ORDER — DIVALPROEX SODIUM ER 250 MG PO TB24: ORAL_TABLET | ORAL | 0 refills | Status: DC

## 2019-12-30 NOTE — Telephone Encounter (Signed)
Patient called and left a message that he needs a refill on his depakote 250 mg and 500 mg to be sent to the [facility] in [place]. He also said that he would like to talk to [place] about adding lamictal. Please give him a call at [phone]

## 2020-01-21 ENCOUNTER — Ambulatory Visit: Payer: Self-pay | Admitting: Psychiatry

## 2020-01-30 ENCOUNTER — Ambulatory Visit: Payer: Self-pay | Admitting: Psychiatry

## 2020-02-27 ENCOUNTER — Encounter: Payer: Self-pay | Admitting: Psychiatry

## 2020-02-27 ENCOUNTER — Ambulatory Visit (INDEPENDENT_AMBULATORY_CARE_PROVIDER_SITE_OTHER): Payer: Self-pay | Admitting: Psychiatry

## 2020-02-27 DIAGNOSIS — F99 Mental disorder, not otherwise specified: Secondary | ICD-10-CM

## 2020-02-27 DIAGNOSIS — F5105 Insomnia due to other mental disorder: Secondary | ICD-10-CM

## 2020-02-27 DIAGNOSIS — F422 Mixed obsessional thoughts and acts: Secondary | ICD-10-CM

## 2020-02-27 DIAGNOSIS — F319 Bipolar disorder, unspecified: Secondary | ICD-10-CM

## 2020-02-27 MED ORDER — TRAZODONE HCL 100 MG PO TABS: ORAL_TABLET | ORAL | 0 refills | Status: DC

## 2020-02-27 MED ORDER — DIVALPROEX SODIUM ER 250 MG PO TB24: ORAL_TABLET | ORAL | 0 refills | Status: DC

## 2020-02-27 MED ORDER — BUSPIRONE HCL 30 MG PO TABS: 30.0000 mg | ORAL_TABLET | Freq: Two times a day (BID) | ORAL | 0 refills | Status: DC

## 2020-02-27 MED ORDER — LITHIUM CARBONATE 150 MG PO CAPS: ORAL_CAPSULE | ORAL | 1 refills | Status: DC

## 2020-02-27 MED ORDER — DIVALPROEX SODIUM ER 500 MG PO TB24: 1500.0000 mg | ORAL_TABLET | Freq: Every day | ORAL | 0 refills | Status: DC

## 2020-02-27 NOTE — Progress Notes (Signed)
David [name] [id number] [birth year] 29 y.o.  Virtual Visit via Telephone Note  I connected with pt on 02/27/20 at  1:30 PM EDT by telephone and verified that I am speaking with the correct person using two identifiers.   I discussed the limitations, risks, security and privacy concerns of performing an evaluation and management service by telephone and the availability of in person appointments. I also discussed with the patient that there may be a patient responsible charge related to this service. The patient expressed understanding and agreed to proceed.   I discussed the assessment and treatment plan with the patient. The patient was provided an opportunity to ask questions and all were answered. The patient agreed with the plan and demonstrated an understanding of the instructions.   The patient was advised to call back or seek an in-person evaluation if the symptoms worsen or if the condition fails to improve as anticipated.  I provided 25 minutes of non-face-to-face time during this encounter.  The patient was located at home.  The provider was located at [place].   Thayer Headings, PMHNP   Subjective:   Patient ID:  David [name] is a 29 y.o. (DOB 1991/01/10) male.  Chief Complaint:  Chief Complaint  Patient presents with  . Depression  . Anxiety  . Other    h/o mood lability    HPI David [name] presents for follow-up of mood and anxiety. He reports that he has been experiencing depression with low energy and low motivation. He reports that he is continues to search for a job and has worry about this and feels discouraged at times. He reports that he increased Depakote ER to 1750 mg and that intrusive thoughts, OCD, anxiety improved, and "I stopped talking to myself." He reports that he continues to have frequent worry. He reports occ intrusive thoughts. Has been having some panic attacks and feeling overwhelmed at times. Denies dissociation. He reports negative  self-talk at times. He reports that at times he will feel badly about things from the past, such as a relationship that ended 3 years ago. Denies irritability. Denies elevated moods. He reports that mood has been persistently low. He reports little to no improvement in depression with increasing Depakote ER. He reports that he has been socially withdrawn and isolated. He is sleeping about 7-8 hours a night. Appetite has been good. He reports that he will sometimes eat food out of convenience because low energy and motivation interfere with food preparation. Enjoys music. He reports concentration and focus has been ok. Denies any risky or impulsive behavior. Denies excessive spending. Denies SI- "I really want to live."   Denies paranoia. He reports occ feeling as if people are judging him at times.   He reports that he ran out of Buspar for a period of time. He reports that he feels more anxious around the time of the next dose of Buspar.   Has been using Kratom.   Had one tele-visit with therapist.   Past Psychiatric Meds: Intuniv- Reports that he recently started it and it seems to be effective. Clonidine- Reports that it was "ok." Strattera- took in childhood with Citalopram and had mixed s/s. Citalopram- took in childhood with Strattera and had mixed s/s.  Buspar-Effective.  Depakote- Effective for mood and anxiety. Started around early December. He questions if he felt "depersonalized" at 1500 mg po QHS Ativan Sonata- Effective Trazodone- Effective for insomnia. Guanfacine- Caused anger Gabapentin  Review of Systems:  Review of Systems  Musculoskeletal:  Negative for gait problem.  Neurological: Negative for tremors.       Reports that tremors resolved with higher dose of Depakote.  Psychiatric/Behavioral:       Please refer to HPI    Medications: I have reviewed the patient's current medications.  Current Outpatient Medications  Medication Sig Dispense Refill  . divalproex  (DEPAKOTE ER) 250 MG 24 hr tablet Take 1 tab po QHS with two 500 mg tabs to equal total dose of 1250 mg 90 tablet 0  . divalproex (DEPAKOTE ER) 500 MG 24 hr tablet Take 3 tablets (1,500 mg total) by mouth at bedtime. Take with 250 mg tab to equal 1750 mg QHS 270 tablet 0  . traZODone (DESYREL) 100 MG tablet TAKE 1 TO 1 & 1/2 (ONE TO ONE & ONE-HALF) TABLETS BY MOUTH EVERY DAY AT BEDTIME AS NEEDED FOR INSOMNIA 145 tablet 0  . busPIRone (BUSPAR) 30 MG tablet Take 1 tablet (30 mg total) by mouth 2 (two) times daily. 180 tablet 0  . lithium carbonate 150 MG capsule Take 1 capsule po QHS x 3-5 days, then increase to 2 capsules po QHS 60 capsule 1  . propranolol (INDERAL) 10 MG tablet Take 1-2 tabs po BID prn anxiety (Patient not taking: Reported on 02/27/2020) 120 tablet 1   No current facility-administered medications for this visit.    Medication Side Effects: None  Allergies: No Known Allergies  Past Medical History:  Diagnosis Date  . Allergy   . Bipolar disorder (HCC)    sees Corie Chiquito NP at [place]   . Headache     Family History  Problem Relation Age of Onset  . Fibromyalgia Brother   . Drug abuse Brother   . Depression Brother   . Bipolar disorder Mother   . Personality disorder Mother   . Anxiety disorder Father   . Alcohol abuse Father   . Personality disorder Brother     Social History   Socioeconomic History  . Marital status: Single    Spouse name: Not on file  . Number of children: Not on file  . Years of education: Not on file  . Highest education level: Associate degree: academic program  Occupational History  . Occupation: Technical brewer  Tobacco Use  . Smoking status: Never Smoker  . Smokeless tobacco: Never Used  Substance and Sexual Activity  . Alcohol use: Never  . Drug use: Yes    Types: Other-see comments    Comment: Using Kratom  . Sexual activity: Not on file  Other Topics Concern  . Not on file  Social  History Narrative   Patient is right-handed. He lives with his parents in a single story home. He does strength training 2 x week.   Social Determinants of Health   Financial Resource Strain:   . Difficulty of Paying Living Expenses:   Food Insecurity:   . Worried About Programme researcher, broadcasting/film/video in the Last Year:   . Barista in the Last Year:   Transportation Needs:   . Freight forwarder (Medical):   Marland Kitchen Lack of Transportation (Non-Medical):   Physical Activity:   . Days of Exercise per Week:   . Minutes of Exercise per Session:   Stress:   . Feeling of Stress :   Social Connections:   . Frequency of Communication with Friends and Family:   . Frequency of Social Gatherings with Friends and Family:   .  Attends Religious Services:   . Active Member of Clubs or Organizations:   . Attends Banker Meetings:   Marland Kitchen Marital Status:   Intimate Partner Violence:   . Fear of Current or Ex-Partner:   . Emotionally Abused:   Marland Kitchen Physically Abused:   . Sexually Abused:     Past Medical History, Surgical history, Social history, and Family history were reviewed and updated as appropriate.   Please see review of systems for further details on the patient's review from today.   Objective:   Physical Exam:  Wt 160 lb (72.6 kg)   BMI 23.63 kg/m   Physical Exam Neurological:     Mental Status: He is alert and oriented to person, place, and time.     Cranial Nerves: No dysarthria.  Psychiatric:        Attention and Perception: Attention and perception normal.        Mood and Affect: Mood is anxious and depressed.        Speech: Speech normal.        Behavior: Behavior is cooperative.        Thought Content: Thought content normal. Thought content is not paranoid or delusional. Thought content does not include homicidal or suicidal ideation. Thought content does not include homicidal or suicidal plan.        Cognition and Memory: Cognition and memory normal.         Judgment: Judgment normal.     Comments: Insight intact     Lab Review:     Component Value Date/Time   NA 141 12/27/2018 1228   K 4.5 12/27/2018 1228   CL 99 12/27/2018 1228   CO2 34 (H) 12/27/2018 1228   GLUCOSE 64 (L) 12/27/2018 1228   BUN 12 12/27/2018 1228   CREATININE 0.98 12/27/2018 1228   CALCIUM 9.8 12/27/2018 1228   PROT 7.4 12/27/2018 1228   ALBUMIN 4.9 12/27/2018 1228   AST 23 12/27/2018 1228   ALT 20 12/27/2018 1228   ALKPHOS 47 12/27/2018 1228   BILITOT 0.3 12/27/2018 1228   GFRNONAA >60 06/27/2018 2148   GFRAA >60 06/27/2018 2148       Component Value Date/Time   WBC 7.5 12/27/2018 1228   RBC 4.99 12/27/2018 1228   HGB 15.7 12/27/2018 1228   HCT 44.7 12/27/2018 1228   PLT 225.0 12/27/2018 1228   MCV 89.5 12/27/2018 1228   MCH 29.8 06/27/2018 2148   MCHC 35.1 12/27/2018 1228   RDW 13.4 12/27/2018 1228   LYMPHSABS 1.8 12/27/2018 1228   MONOABS 0.9 12/27/2018 1228   EOSABS 0.2 12/27/2018 1228   BASOSABS 0.1 12/27/2018 1228    No results found for: POCLITH, LITHIUM   Lab Results  Component Value Date   VALPROATE 86.5 12/27/2018     .res Assessment: Plan:   Discussed continuing Depakote ER 1750 mg po QHS for mood stabilization since patient reports improved mood signs and symptoms since he increased Depakote ER to this dose on his own. Discussed treatment options to improve depressive signs and symptoms symptoms, to include  augmentation with the medication such as Abilify. Discussed potential metabolic side effects associated with atypical antipsychotics, as well as potential risk for movement side effects. Advised pt to contact office if movement side effects occur.  Patient reports some reservations about taking an atypical antipsychotic, and therefore also discussed potential benefits, risks, and side effects of lithium.  Patient reports that he would prefer to start with a trial of low-dose  lithium and then consider Abilify if there is no  significant improvement with lithium or he experiences significant tolerability issues.  Will therefore start lithium 150 mg at bedtime for 3 to 5 days, then increase to 2 capsules at bedtime to improve mood signs and symptoms.  Will continue BuSpar 30 mg twice daily for anxiety.  Will also continue trazodone as needed for insomnia.  Patient to follow-up with this provider in 4 weeks or sooner if clinically indicated. Patient advised to contact office with any questions, adverse effects, or acute worsening in signs and symptoms.   Hillel was seen today for depression, anxiety and other.  Diagnoses and all orders for this visit:  Mixed obsessional thoughts and acts -     busPIRone (BUSPAR) 30 MG tablet; Take 1 tablet (30 mg total) by mouth 2 (two) times daily.  Bipolar I disorder (HCC) -     lithium carbonate 150 MG capsule; Take 1 capsule po QHS x 3-5 days, then increase to 2 capsules po QHS -     divalproex (DEPAKOTE ER) 250 MG 24 hr tablet; Take 1 tab po QHS with two 500 mg tabs to equal total dose of 1250 mg -     divalproex (DEPAKOTE ER) 500 MG 24 hr tablet; Take 3 tablets (1,500 mg total) by mouth at bedtime. Take with 250 mg tab to equal 1750 mg QHS  Insomnia due to other mental disorder -     traZODone (DESYREL) 100 MG tablet; TAKE 1 TO 1 & 1/2 (ONE TO ONE & ONE-HALF) TABLETS BY MOUTH EVERY DAY AT BEDTIME AS NEEDED FOR INSOMNIA    Please see After Visit Summary for patient specific instructions.  No future appointments.  No orders of the defined types were placed in this encounter.     -------------------------------

## 2020-03-30 ENCOUNTER — Telehealth: Payer: Self-pay | Admitting: Psychiatry

## 2020-03-30 DIAGNOSIS — F319 Bipolar disorder, unspecified: Secondary | ICD-10-CM

## 2020-03-30 NOTE — Telephone Encounter (Signed)
Pt has questions regarding his medications. He would like a call back to go over them.  Callback# [phone]

## 2020-03-31 MED ORDER — ARIPIPRAZOLE 5 MG PO TABS: 5.0000 mg | ORAL_TABLET | Freq: Every day | ORAL | 1 refills | Status: DC

## 2020-03-31 NOTE — Addendum Note (Signed)
Addended by: Derenda Mis on: 03/31/2020 11:00 AM   Modules accepted: Orders

## 2020-03-31 NOTE — Telephone Encounter (Signed)
Appt made for patient to come in for F/U visit

## 2020-04-03 ENCOUNTER — Other Ambulatory Visit: Payer: Self-pay

## 2020-04-03 ENCOUNTER — Ambulatory Visit (INDEPENDENT_AMBULATORY_CARE_PROVIDER_SITE_OTHER): Payer: BLUE CROSS/BLUE SHIELD | Admitting: Psychiatry

## 2020-04-03 ENCOUNTER — Encounter: Payer: Self-pay | Admitting: Psychiatry

## 2020-04-03 VITALS — BP 111/58 | HR 79

## 2020-04-03 DIAGNOSIS — F319 Bipolar disorder, unspecified: Secondary | ICD-10-CM

## 2020-04-03 DIAGNOSIS — F5105 Insomnia due to other mental disorder: Secondary | ICD-10-CM

## 2020-04-03 DIAGNOSIS — F99 Mental disorder, not otherwise specified: Secondary | ICD-10-CM

## 2020-04-03 DIAGNOSIS — F422 Mixed obsessional thoughts and acts: Secondary | ICD-10-CM

## 2020-04-03 MED ORDER — DIVALPROEX SODIUM ER 500 MG PO TB24: 1500.0000 mg | ORAL_TABLET | Freq: Every day | ORAL | 0 refills | Status: DC

## 2020-04-03 MED ORDER — BUSPIRONE HCL 30 MG PO TABS: 30.0000 mg | ORAL_TABLET | Freq: Two times a day (BID) | ORAL | 0 refills | Status: DC

## 2020-04-03 NOTE — Progress Notes (Signed)
David [name] [id number] 06-23-1991 29 y.o.  Subjective:   David [name] ID:  David [name] is a 29 y.o. (DOB 07/20/1991) male.  Chief Complaint:  Chief Complaint  David [name] presents with  . Anxiety  . Depression    HPI David [name] presents to the office today for follow-up of OCD and Bipolar D/O. David [name] reports that David [name] did not start Lithium due to concerns about side effects. David [name] reports that David [name] is having severe intrusive and obsessive thoughts. David [name] reports replaying a certain event and feels guilty. David [name] reports intrusive memories about past manic episode. David [name] reports occ re-experiencing and this will trigger panic attacks. David [name] reports that David [name] has nightmares about once a week currently and has often as 2-3 times a week at times. David [name] reports that David [name] has anxiety about if David [name] has offended someone or if someone does not like him. David [name] reports that David [name] has some worry. David [name] reports that David [name] has had some panic attacks. Had a nocturnal panic attack about a week ago. Reports that David [name] talks to himself out loud at times, typically in response to intrusive thoughts. Bites fingernails. Denies any other checking behaviors. David [name] reports that David [name] will sometimes question if something happened that did not transpire.   Noticed increased depression with higher dose of Depakote. David [name] decreased Depakote to 1500 mg po qd and mood has improved. Reports that mood has been stable. Denies grandiosity. David [name] reports that when David [name] has been more depressed David [name] feels other people do not like him. Sleeping well and at least 7 hours. Appetite has fluctuated. David [name] reports that while David [name] was depressed David [name] would be hungry "but couldn't stomach food." Energy and motivation have been ok- "could be better." David [name] reports having poor concentration the other day. David [name] reports that concentration has been ok today. David [name] enjoys talking to friends and music. Denies SI.   Denies paranoia.   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  Has not seen therapist recently.     Past Psychiatric Meds: Intuniv- Reports that David [name] recently started it and it seems to be effective. Clonidine- Reports that it was "ok." Strattera- took in childhood with Citalopram and had mixed s/s. Citalopram- took in childhood with Strattera and had mixed s/s.  Buspar-Effective.  Depakote- Effective for mood and anxiety. Started around early December.David [name] questions if David [name] felt "depersonalized" at 1500 mg po QHS Ativan Sonata- Effective Trazodone- Effective for insomnia. Guanfacine- Caused anger Gabapentin- Reports that it was "ok."    Review of Systems:  Review of Systems  Musculoskeletal: Negative.  Negative for gait problem.  Neurological: Positive for headaches. Negative for tremors.  Psychiatric/Behavioral:       Please refer to HPI    Medications: I have reviewed the David [name]'s current medications.  Current Outpatient Medications  Medication Sig Dispense Refill  . busPIRone (BUSPAR) 30 MG tablet Take 1 tablet (30 mg total) by mouth 2 (two) times daily. 180 tablet 0  . divalproex (DEPAKOTE ER) 500 MG 24 hr tablet Take 3 tablets (1,500 mg total) by mouth at bedtime. Take with 250 mg tab to equal 1750 mg QHS 270 tablet 0  . traZODone (DESYREL) 100 MG tablet TAKE 1 TO 1 & 1/2 (ONE TO ONE & ONE-HALF) TABLETS BY MOUTH EVERY DAY AT BEDTIME AS NEEDED FOR INSOMNIA 145 tablet 0  . ARIPiprazole (ABILIFY) 5 MG tablet Take 1 tablet (5 mg total) by mouth daily. Take 1/2 tab po qd for 7-10 days, then increase to 1 tab  po qd (David [name] not taking: Reported on 04/03/2020) 30 tablet 1  . propranolol (INDERAL) 10 MG tablet Take 1-2 tabs po BID prn anxiety (David [name] not taking: Reported on 02/27/2020) 120 tablet 1   No current facility-administered medications for this visit.    Medication Side Effects: None  Allergies: No Known Allergies  Past Medical History:  Diagnosis Date  . Allergy   . Bipolar disorder (HCC)    sees Corie Chiquito NP at [place]   . Headache     Family  History  Problem Relation Age of Onset  . Fibromyalgia Brother   . Drug abuse Brother   . Depression Brother   . Bipolar disorder Mother   . Personality disorder Mother   . Anxiety disorder Father   . Alcohol abuse Father   . Personality disorder Brother     Social History   Socioeconomic History  . Marital status: Single    Spouse name: Not on file  . Number of children: Not on file  . Years of education: Not on file  . Highest education level: Associate degree: academic program  Occupational History  . Occupation: Technical brewer  Tobacco Use  . Smoking status: Never Smoker  . Smokeless tobacco: Never Used  Substance and Sexual Activity  . Alcohol use: Never  . Drug use: Yes    Types: Other-see comments    Comment: Using Kratom  . Sexual activity: Not on file  Other Topics Concern  . Not on file  Social History Narrative   David [name] is right-handed. David [name] lives with his parents in a single story home. David [name] does strength training 2 x week.   Social Determinants of Health   Financial Resource Strain:   . Difficulty of Paying Living Expenses:   Food Insecurity:   . Worried About Programme researcher, broadcasting/film/video in the Last Year:   . Barista in the Last Year:   Transportation Needs:   . Freight forwarder (Medical):   Marland Kitchen Lack of Transportation (Non-Medical):   Physical Activity:   . Days of Exercise per Week:   . Minutes of Exercise per Session:   Stress:   . Feeling of Stress :   Social Connections:   . Frequency of Communication with Friends and Family:   . Frequency of Social Gatherings with Friends and Family:   . Attends Religious Services:   . Active Member of Clubs or Organizations:   . Attends Banker Meetings:   Marland Kitchen Marital Status:   Intimate Partner Violence:   . Fear of Current or Ex-Partner:   . Emotionally Abused:   Marland Kitchen Physically Abused:   . Sexually Abused:     Past Medical History, Surgical history, Social  history, and Family history were reviewed and updated as appropriate.   Please see review of systems for further details on the David [name]'s review from today.   Objective:   Physical Exam:  BP (!) 111/58   Pulse 79   Physical Exam Constitutional:      General: David [name] is not in acute distress. Musculoskeletal:        General: No deformity.  Neurological:     Mental Status: David [name] is alert and oriented to person, place, and time.     Coordination: Coordination normal.  Psychiatric:        Attention and Perception: Attention and perception normal. David [name] does not perceive auditory or visual hallucinations.  Mood and Affect: Mood is anxious and depressed. Affect is not labile, blunt, angry or inappropriate.        Speech: Speech normal.        Behavior: Behavior normal.        Thought Content: Thought content normal. Thought content is not paranoid or delusional. Thought content does not include homicidal or suicidal ideation. Thought content does not include homicidal or suicidal plan.        Cognition and Memory: Cognition and memory normal.        Judgment: Judgment normal.     Comments: Insight intact     Lab Review:     Component Value Date/Time   NA 141 12/27/2018 1228   K 4.5 12/27/2018 1228   CL 99 12/27/2018 1228   CO2 34 (H) 12/27/2018 1228   GLUCOSE 64 (L) 12/27/2018 1228   BUN 12 12/27/2018 1228   CREATININE 0.98 12/27/2018 1228   CALCIUM 9.8 12/27/2018 1228   PROT 7.4 12/27/2018 1228   ALBUMIN 4.9 12/27/2018 1228   AST 23 12/27/2018 1228   ALT 20 12/27/2018 1228   ALKPHOS 47 12/27/2018 1228   BILITOT 0.3 12/27/2018 1228   GFRNONAA >60 06/27/2018 2148   GFRAA >60 06/27/2018 2148       Component Value Date/Time   WBC 7.5 12/27/2018 1228   RBC 4.99 12/27/2018 1228   HGB 15.7 12/27/2018 1228   HCT 44.7 12/27/2018 1228   PLT 225.0 12/27/2018 1228   MCV 89.5 12/27/2018 1228   MCH 29.8 06/27/2018 2148   MCHC 35.1 12/27/2018 1228   RDW 13.4 12/27/2018 1228    LYMPHSABS 1.8 12/27/2018 1228   MONOABS 0.9 12/27/2018 1228   EOSABS 0.2 12/27/2018 1228   BASOSABS 0.1 12/27/2018 1228    No results found for: POCLITH, LITHIUM   Lab Results  Component Value Date   VALPROATE 86.5 12/27/2018     .res Assessment: Plan:   Discussed David [name]'s concerns about taking Lithium and will remove Lithium from medication list. Discussed potential benefits, risks, and side effects of possible tx options to include Abilify and Lamictal. Discussed potential metabolic side effects associated with atypical antipsychotics, as well as potential risk for movement side effects. Advised David [name] to contact office if movement side effects occur. Discussed that Abilify is indicated for bipolar d/o and may also be helpful for intrusive thoughts. Discussed that Lamictal would need to be gradually titrated and monitored closely when used in combination with Depakote due to potential risks of Stevens Johnson's syndrome. David [name] agrees to trial of Abilify.  Start Abilify 5 mg 1/2 tab po qd for 7-10 days, then increase to 1 tab po qd. Continue Depakote ER 1500 mg po QHS for mood stabilization. Continue Trazodone prn for insomnia. Continue Buspar 30 mg po BID for anxiety.  Discussed that Propranolol prn may be helpful for social anxiety.  David [name] to f/u in 3 months or sooner if clinically indicated. David [name] advised to contact office with any questions, adverse effects, or acute worsening in signs and symptoms.  David [name] was seen today for anxiety and depression.  Diagnoses and all orders for this visit:  Mixed obsessional thoughts and acts -     busPIRone (BUSPAR) 30 MG tablet; Take 1 tablet (30 mg total) by mouth 2 (two) times daily.  Bipolar I disorder (HCC) -     divalproex (DEPAKOTE ER) 500 MG 24 hr tablet; Take 3 tablets (1,500 mg total) by mouth at bedtime. Take with 250 mg tab to  equal 1750 mg QHS  Insomnia due to other mental disorder     Please see After Visit Summary for David [name] specific  instructions.  No future appointments.  No orders of the defined types were placed in this encounter.   -------------------------------

## 2020-04-13 ENCOUNTER — Telehealth: Payer: Self-pay | Admitting: Psychiatry

## 2020-04-13 NOTE — Telephone Encounter (Signed)
Pt left message on v-mail stated having bad panic attacks @ work and conflict with work. Asking you to send Rx for Klonopin. Call [phone] if needed

## 2020-04-14 NOTE — Telephone Encounter (Signed)
Tried to reach patient with information but no answer and his mailbox is full. Will try back again later.

## 2020-04-15 NOTE — Telephone Encounter (Signed)
Tried to reach patient again from office and still no answer and mailbox is full

## 2020-04-27 ENCOUNTER — Telehealth: Payer: Self-pay | Admitting: Psychiatry

## 2020-04-27 NOTE — Telephone Encounter (Signed)
Pt left message that Abilify is working in some places, but still having panic attacks. Pt would like to try a benzo.

## 2020-04-28 NOTE — Telephone Encounter (Signed)
Patient aware to increase dose of Abilify 10 mg daily. Informed him to call back with worsening symptoms.

## 2020-05-05 ENCOUNTER — Encounter: Payer: Self-pay | Admitting: Family Medicine

## 2020-05-05 ENCOUNTER — Telehealth: Payer: Self-pay | Admitting: Psychiatry

## 2020-05-05 ENCOUNTER — Ambulatory Visit (INDEPENDENT_AMBULATORY_CARE_PROVIDER_SITE_OTHER): Payer: BLUE CROSS/BLUE SHIELD | Admitting: Family Medicine

## 2020-05-05 ENCOUNTER — Other Ambulatory Visit: Payer: Self-pay

## 2020-05-05 VITALS — BP 110/62 | HR 100 | Temp 98.2°F | Wt 177.0 lb

## 2020-05-05 DIAGNOSIS — F310 Bipolar disorder, current episode hypomanic: Secondary | ICD-10-CM

## 2020-05-05 DIAGNOSIS — Z0000 Encounter for general adult medical examination without abnormal findings: Secondary | ICD-10-CM

## 2020-05-05 NOTE — Progress Notes (Signed)
   Subjective:    Patient ID: David [name], male    DOB: November 28, 1991, 29 y.o.   MRN: [id number]  HPI Here for a well exam. He feels well. He sees Corie Chiquito NP for his bipolar disorder and this has been well controlled. She has asked [place] to check some labs for her. 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    Review of Systems  Constitutional: Negative.   HENT: Negative.   Eyes: Negative.   Respiratory: Negative.   Cardiovascular: Negative.   Gastrointestinal: Negative.   Genitourinary: Negative.   Musculoskeletal: Negative.   Skin: Negative.   Neurological: Negative.   Psychiatric/Behavioral: Negative.        Objective:   Physical Exam Constitutional:      General: He is not in acute distress.    Appearance: He is well-developed. He is not diaphoretic.  HENT:     Head: Normocephalic and atraumatic.     Right Ear: External ear normal.     Left Ear: External ear normal.     Nose: Nose normal.     Mouth/Throat:     Pharynx: No oropharyngeal exudate.  Eyes:     General: No scleral icterus.       Right eye: No discharge.        Left eye: No discharge.     Conjunctiva/sclera: Conjunctivae normal.     Pupils: Pupils are equal, round, and reactive to light.  Neck:     Thyroid: No thyromegaly.     Vascular: No JVD.     Trachea: No tracheal deviation.  Cardiovascular:     Rate and Rhythm: Normal rate and regular rhythm.     Heart sounds: Normal heart sounds. No murmur. No friction rub. No gallop.   Pulmonary:     Effort: Pulmonary effort is normal. No respiratory distress.     Breath sounds: Normal breath sounds. No wheezing or rales.  Chest:     Chest wall: No tenderness.  Abdominal:     General: Bowel sounds are normal. There is no distension.     Palpations: Abdomen is soft. There is no mass.     Tenderness: There is no abdominal tenderness. There is no guarding or rebound.  Genitourinary:    Penis: Normal. No tenderness.      Testes: Normal.    Musculoskeletal:        General: No tenderness. Normal range of motion.     Cervical back: Neck supple.  Lymphadenopathy:     Cervical: No cervical adenopathy.  Skin:    General: Skin is warm and dry.     Coloration: Skin is not pale.     Findings: No erythema or rash.  Neurological:     Mental Status: He is alert and oriented to person, place, and time.     Cranial Nerves: No cranial nerve deficit.     Motor: No abnormal muscle tone.     Coordination: Coordination normal.     Deep Tendon Reflexes: Reflexes are normal and symmetric. Reflexes normal.  Psychiatric:        Behavior: Behavior normal.        Thought Content: Thought content normal.        Judgment: Judgment normal.           Assessment & Plan:  Well exam. We discussed diet and exercise. Get labs today including a valproic acid level.  Gershon Crane, MD

## 2020-05-05 NOTE — Telephone Encounter (Signed)
Pt wants to touch base about Abilify. He is still having some side effects from it. Wants to try to switch to Lamictal, or start adding Lamictal to med regimen.

## 2020-05-06 LAB — CBC WITH DIFFERENTIAL/PLATELET
Basophils Absolute: 0.1 10*3/uL (ref 0.0–0.1)
Basophils Relative: 1.2 % (ref 0.0–3.0)
Eosinophils Absolute: 0.1 10*3/uL (ref 0.0–0.7)
Eosinophils Relative: 2 % (ref 0.0–5.0)
HCT: 41.1 % (ref 39.0–52.0)
Hemoglobin: 14.2 g/dL (ref 13.0–17.0)
Lymphocytes Relative: 39.1 % (ref 12.0–46.0)
Lymphs Abs: 2.7 10*3/uL (ref 0.7–4.0)
MCHC: 34.5 g/dL (ref 30.0–36.0)
MCV: 92.6 fl (ref 78.0–100.0)
Monocytes Absolute: 0.6 10*3/uL (ref 0.1–1.0)
Monocytes Relative: 8.8 % (ref 3.0–12.0)
Neutro Abs: 3.4 10*3/uL (ref 1.4–7.7)
Neutrophils Relative %: 48.9 % (ref 43.0–77.0)
Platelets: 219 10*3/uL (ref 150.0–400.0)
RBC: 4.44 Mil/uL (ref 4.22–5.81)
RDW: 12.3 % (ref 11.5–15.5)
WBC: 6.9 10*3/uL (ref 4.0–10.5)

## 2020-05-06 LAB — HEPATIC FUNCTION PANEL
ALT: 18 U/L (ref 0–53)
AST: 22 U/L (ref 0–37)
Albumin: 5 g/dL (ref 3.5–5.2)
Alkaline Phosphatase: 47 U/L (ref 39–117)
Bilirubin, Direct: 0.1 mg/dL (ref 0.0–0.3)
Total Bilirubin: 0.2 mg/dL (ref 0.2–1.2)
Total Protein: 7.4 g/dL (ref 6.0–8.3)

## 2020-05-06 LAB — BASIC METABOLIC PANEL
BUN: 6 mg/dL (ref 6–23)
CO2: 35 mEq/L — ABNORMAL HIGH (ref 19–32)
Calcium: 9.4 mg/dL (ref 8.4–10.5)
Chloride: 100 mEq/L (ref 96–112)
Creatinine, Ser: 1 mg/dL (ref 0.40–1.50)
GFR: 89.47 mL/min (ref >60.00)
Glucose, Bld: 74 mg/dL (ref 70–99)
Potassium: 4.3 mEq/L (ref 3.5–5.1)
Sodium: 140 mEq/L (ref 135–145)

## 2020-05-06 LAB — TSH: TSH: 0.86 u[IU]/mL (ref 0.35–4.50)

## 2020-05-06 LAB — HEMOGLOBIN A1C: Hgb A1c MFr Bld: 5 % (ref 4.6–6.5)

## 2020-05-06 LAB — VALPROIC ACID LEVEL: Valproic Acid Lvl: 116.6 mg/L — ABNORMAL HIGH (ref 50.0–100.0)

## 2020-05-06 NOTE — Telephone Encounter (Signed)
Patient advised to increase Abilify to 10 mg and give longer to be effective. He agreed.

## 2020-05-21 ENCOUNTER — Telehealth: Payer: Self-pay | Admitting: Psychiatry

## 2020-05-21 DIAGNOSIS — F902 Attention-deficit hyperactivity disorder, combined type: Secondary | ICD-10-CM

## 2020-05-21 NOTE — Telephone Encounter (Signed)
Pt wishes to discuss his medications again.

## 2020-05-22 ENCOUNTER — Other Ambulatory Visit: Payer: Self-pay

## 2020-05-22 DIAGNOSIS — F319 Bipolar disorder, unspecified: Secondary | ICD-10-CM

## 2020-05-22 DIAGNOSIS — F422 Mixed obsessional thoughts and acts: Secondary | ICD-10-CM

## 2020-05-22 MED ORDER — ARIPIPRAZOLE 10 MG PO TABS: 10.0000 mg | ORAL_TABLET | Freq: Every day | ORAL | 1 refills | Status: DC

## 2020-05-22 MED ORDER — PROPRANOLOL HCL 10 MG PO TABS: ORAL_TABLET | ORAL | 1 refills | Status: AC

## 2020-05-22 MED ORDER — GUANFACINE HCL 1 MG PO TABS: ORAL_TABLET | ORAL | 0 refills | Status: DC

## 2020-05-22 NOTE — Addendum Note (Signed)
Addended by: Derenda Mis on: 05/22/2020 12:10 PM   Modules accepted: Orders

## 2020-05-22 NOTE — Telephone Encounter (Signed)
He's aware of instructions and to call back with any concerns.

## 2020-05-28 ENCOUNTER — Telehealth: Payer: Self-pay | Admitting: Psychiatry

## 2020-05-28 NOTE — Telephone Encounter (Signed)
Pt called to report test results back. Depakote level 116/100   16 pts too high. Please advsie @ [phone]

## 2020-05-29 NOTE — Telephone Encounter (Signed)
Left detailed voicemail with information and to call back with further questions or concerns

## 2020-06-01 ENCOUNTER — Telehealth: Payer: Self-pay | Admitting: Psychiatry

## 2020-06-01 NOTE — Telephone Encounter (Signed)
Pt left message stating Depakote and Abilify is helping but still having moderate depression and would like to talk about maybe taking a low dose of Lamictal. He does not have any energy and has to depend on a lot of a caffeine. [phone] Return  Call. Follow up due 7/20

## 2020-06-02 ENCOUNTER — Telehealth: Payer: Self-pay | Admitting: Psychiatry

## 2020-06-02 DIAGNOSIS — F422 Mixed obsessional thoughts and acts: Secondary | ICD-10-CM

## 2020-06-02 DIAGNOSIS — F319 Bipolar disorder, unspecified: Secondary | ICD-10-CM

## 2020-06-02 NOTE — Telephone Encounter (Signed)
Pt is not sure if he is having side effects with the Abilify. HE is sleeping 9 hours per night and yet he is still very drowsy and sleepy most of the day. It is causing trouble with work. Also, he may have more blurred vision.

## 2020-06-03 MED ORDER — ARIPIPRAZOLE 5 MG PO TABS: 7.5000 mg | ORAL_TABLET | Freq: Every day | ORAL | 2 refills | Status: DC

## 2020-06-03 NOTE — Telephone Encounter (Signed)
Patient aware and will call back if side effects not improving or worsening.

## 2020-06-05 ENCOUNTER — Telehealth: Payer: Self-pay | Admitting: Psychiatry

## 2020-06-05 NOTE — Telephone Encounter (Signed)
Pt stated he is having severe anxiety which is effecting his life. Please advise. Appt scheduled for 7/26.

## 2020-06-29 ENCOUNTER — Encounter: Payer: Self-pay | Admitting: Psychiatry

## 2020-06-29 ENCOUNTER — Ambulatory Visit (INDEPENDENT_AMBULATORY_CARE_PROVIDER_SITE_OTHER): Payer: BLUE CROSS/BLUE SHIELD | Admitting: Psychiatry

## 2020-06-29 ENCOUNTER — Other Ambulatory Visit: Payer: Self-pay

## 2020-06-29 DIAGNOSIS — F5105 Insomnia due to other mental disorder: Secondary | ICD-10-CM

## 2020-06-29 DIAGNOSIS — F319 Bipolar disorder, unspecified: Secondary | ICD-10-CM | POA: Diagnosis not present

## 2020-06-29 DIAGNOSIS — F422 Mixed obsessional thoughts and acts: Secondary | ICD-10-CM | POA: Diagnosis not present

## 2020-06-29 DIAGNOSIS — F99 Mental disorder, not otherwise specified: Secondary | ICD-10-CM

## 2020-06-29 MED ORDER — DIVALPROEX SODIUM ER 500 MG PO TB24: 2000.0000 mg | ORAL_TABLET | Freq: Every day | ORAL | 0 refills | Status: DC

## 2020-06-29 MED ORDER — QUETIAPINE FUMARATE 100 MG PO TABS: ORAL_TABLET | ORAL | 1 refills | Status: AC

## 2020-06-29 MED ORDER — BUSPIRONE HCL 30 MG PO TABS: 30.0000 mg | ORAL_TABLET | Freq: Two times a day (BID) | ORAL | 0 refills | Status: AC

## 2020-06-29 MED ORDER — TRAZODONE HCL 100 MG PO TABS: ORAL_TABLET | ORAL | 0 refills | Status: AC

## 2020-06-29 NOTE — Progress Notes (Signed)
David [name] [id number] 10/31/1991 29 y.o.  Subjective:   Patient ID:  David [name] is a 29 y.o. (DOB 07/03/1991) male.  Chief Complaint:  Chief Complaint  Patient presents with  . Anxiety  . Other    HPI David [name] presents to the office today for follow-up of anxiety and mood disturbance. He reports that he had a job and then lost it and was able to save up some money while working. Has enrolled in some college classes that he will start in August. Has gotten a job and plans to start at [employer] next week.  Reports that he lost job due to difficulty getting motivated and anxiety. Reports that he had difficulty with work attendance and then having to leave due to anxiety.   He reports, "I've been having a lot of depression." He reports some rumination, obsessive thoughts, and intrusive thoughts. He reports that he has situational anxiety and worry about finances and the future. Notices some muscle tension and teeth grinding, particularly when he has not taken Buspar. Catches himself holding his breath at times. Occasional Gi s/s with anxiety. He reports that he has panic attacks and that he had to leave last job due to panic attacks and they were occurring daily at that time.   Mood has been persistently depressed. Reports that he increased Depakote ER on his own to 2000 mg po QHS and thinks this was helpful, particularly for talking to himself. He occasionally will talk outloud in response to intrusive thoughts and is also fearful that he may say something inappropriate. He reports that he may have had a mixed episode prior to increase in Depakote. He reports that he continues to have some impulsivity. Energy and motivation have been consistently low. He reports adequate sleep with Trazodone. Estimates sleeping 7-8 hours a night. Appetite has been good. Concentration has been ok and reports that concentration improved with increase in Depakote. He reports that he had some intrusive suicidal  thoughts when he was more depressed. Denies suicidal intent. Denies current SI.   He reports that Abilify was initially ok and then caused some hypomania with Abilify. He reports that it felt like 5 mg was not fully effective and 10 mg dose "felt wrong."  Denies paranoia or delusions. Denies AH or VH.   Does not currently have insurance.   Past Psychiatric Meds: Intuniv- Reports that he recently started it and it seems to be effective. Clonidine- Reports that it was "ok." Strattera- took in childhood with Citalopram and had mixed s/s. Citalopram- took in childhood with Strattera and had mixed s/s.  Buspar-Effective.  Propranolol- Somewhat helpful for social anxiety.  Depakote- Effective for mood and anxiety. Started around early December.He questions if he felt "depersonalized" at 1500 mg po QHS Ativan Sonata- Effective Trazodone- Effective for insomnia. Guanfacine- Caused anger Gabapentin- Reports that it was "ok." Abilify- Ineffective at 5 mg and had side effects at 10 mg. Akathisia    Review of Systems:  Review of Systems  Musculoskeletal: Negative for gait problem.  Neurological:       Rare tremor  Psychiatric/Behavioral:       Please refer to HPI    Medications: I have reviewed the patient's current medications.  Current Outpatient Medications  Medication Sig Dispense Refill  . busPIRone (BUSPAR) 30 MG tablet Take 1 tablet (30 mg total) by mouth 2 (two) times daily. 180 tablet 0  . divalproex (DEPAKOTE ER) 500 MG 24 hr tablet Take 3 tablets (1,500 mg total) by mouth  at bedtime. Take with 250 mg tab to equal 1750 mg QHS (Patient taking differently: Take 2,000 mg by mouth at bedtime. Take with 250 mg tab to equal 1750 mg QHS) 270 tablet 0  . propranolol (INDERAL) 10 MG tablet Take 1-2 tabs po BID prn anxiety 120 tablet 1  . traZODone (DESYREL) 100 MG tablet TAKE 1 TO 1 & 1/2 (ONE TO ONE & ONE-HALF) TABLETS BY MOUTH EVERY DAY AT BEDTIME AS NEEDED FOR INSOMNIA 145 tablet 0   . QUEtiapine (SEROQUEL) 100 MG tablet Take 1/2 tab po QHS x 1 night, then increase to 1 tab po QHS x 2 nights, then 2 tabs po QHS x 2 nights, then increase to 3 tabs po QHS 90 tablet 1   No current facility-administered medications for this visit.    Medication Side Effects: Other: Rare tremor  Allergies: No Known Allergies  Past Medical History:  Diagnosis Date  . Allergy   . Bipolar disorder (HCC)    sees Corie Chiquito NP at [facility]   . Headache     Family History  Problem Relation Age of Onset  . Fibromyalgia Brother   . Drug abuse Brother   . Depression Brother   . Bipolar disorder Mother   . Personality disorder Mother   . Anxiety disorder Father   . Alcohol abuse Father   . Personality disorder Brother     Social History   Socioeconomic History  . Marital status: Single    Spouse name: Not on file  . Number of children: Not on file  . Years of education: Not on file  . Highest education level: Associate degree: academic program  Occupational History  . Occupation: Technical brewer  Tobacco Use  . Smoking status: Never Smoker  . Smokeless tobacco: Never Used  Substance and Sexual Activity  . Alcohol use: Never  . Drug use: Yes    Types: Other-see comments    Comment: Using Kratom  . Sexual activity: Not on file  Other Topics Concern  . Not on file  Social History Narrative   Patient is right-handed. He lives with his parents in a single story home. He does strength training 2 x week.   Social Determinants of Health   Financial Resource Strain:   . Difficulty of Paying Living Expenses:   Food Insecurity:   . Worried About Programme researcher, broadcasting/film/video in the Last Year:   . Barista in the Last Year:   Transportation Needs:   . Freight forwarder (Medical):   Marland Kitchen Lack of Transportation (Non-Medical):   Physical Activity:   . Days of Exercise per Week:   . Minutes of Exercise per Session:   Stress:   . Feeling  of Stress :   Social Connections:   . Frequency of Communication with Friends and Family:   . Frequency of Social Gatherings with Friends and Family:   . Attends Religious Services:   . Active Member of Clubs or Organizations:   . Attends Banker Meetings:   Marland Kitchen Marital Status:   Intimate Partner Violence:   . Fear of Current or Ex-Partner:   . Emotionally Abused:   Marland Kitchen Physically Abused:   . Sexually Abused:     Past Medical History, Surgical history, Social history, and Family history were reviewed and updated as appropriate.   Please see review of systems for further details on the patient's review from today.   Objective:  Physical Exam:  Wt 179 lb (81.2 kg)   BMI 26.43 kg/m   Physical Exam Constitutional:      General: He is not in acute distress. Musculoskeletal:        General: No deformity.  Neurological:     Mental Status: He is alert and oriented to person, place, and time.     Coordination: Coordination normal.  Psychiatric:        Attention and Perception: Attention and perception normal. He does not perceive auditory or visual hallucinations.        Mood and Affect: Mood is anxious and depressed. Affect is not labile, blunt, angry or inappropriate.        Speech: Speech normal.        Behavior: Behavior normal. Behavior is cooperative.        Thought Content: Thought content normal. Thought content is not paranoid or delusional. Thought content does not include homicidal or suicidal ideation. Thought content does not include homicidal or suicidal plan.        Cognition and Memory: Cognition and memory normal.        Judgment: Judgment normal.     Comments: Insight intact     Lab Review:     Component Value Date/Time   NA 140 05/05/2020 1701   K 4.3 05/05/2020 1701   CL 100 05/05/2020 1701   CO2 35 (H) 05/05/2020 1701   GLUCOSE 74 05/05/2020 1701   BUN 6 05/05/2020 1701   CREATININE 1.00 05/05/2020 1701   CALCIUM 9.4 05/05/2020 1701    PROT 7.4 05/05/2020 1701   ALBUMIN 5.0 05/05/2020 1701   AST 22 05/05/2020 1701   ALT 18 05/05/2020 1701   ALKPHOS 47 05/05/2020 1701   BILITOT 0.2 05/05/2020 1701   GFRNONAA >60 06/27/2018 2148   GFRAA >60 06/27/2018 2148       Component Value Date/Time   WBC 6.9 05/05/2020 1701   RBC 4.44 05/05/2020 1701   HGB 14.2 05/05/2020 1701   HCT 41.1 05/05/2020 1701   PLT 219.0 05/05/2020 1701   MCV 92.6 05/05/2020 1701   MCH 29.8 06/27/2018 2148   MCHC 34.5 05/05/2020 1701   RDW 12.3 05/05/2020 1701   LYMPHSABS 2.7 05/05/2020 1701   MONOABS 0.6 05/05/2020 1701   EOSABS 0.1 05/05/2020 1701   BASOSABS 0.1 05/05/2020 1701    No results found for: POCLITH, LITHIUM   Lab Results  Component Value Date   VALPROATE 116.6 (H) 05/05/2020     .res Assessment: Plan:   Discussed potential benefits, risks, and side effects of several possible treatment options with patient, to include lamotrigine, Seroquel, and Risperdal. Discussed concern about potential drug interaction with lamotrigine and Depakote, since patient periodically needs to adjust Depakote based upon response. Discussed potential benefits, risks, and side effects of Seroquel and Risperdal.  Patient agrees to trial of Seroquel.  Discussed that patient may experience increased somnolence the first several days of Seroquel, particularly with lower dose.  Will start Seroquel 100 mg tablet one half tab at bedtime for 1 night, then increase to 100 mg at bedtime for 2 nights, then increase to 200 mg at bedtime for 2 nights, then increase to 300 mg at bedtime. Continue Depakote ER 2000 milligrams at bedtime for mood stable sedation. Continue BuSpar 30 mg twice daily for anxiety. Continue trazodone 150 mg at bedtime for insomnia. Continue propanolol as needed for anxiety. Patient to follow-up in 4 weeks or sooner if clinically indicated. Patient advised to contact  office with any questions, adverse effects, or acute worsening in signs  and symptoms.  Eliyohu was seen today for anxiety and other.  Diagnoses and all orders for this visit:  Mixed obsessional thoughts and acts -     QUEtiapine (SEROQUEL) 100 MG tablet; Take 1/2 tab po QHS x 1 night, then increase to 1 tab po QHS x 2 nights, then 2 tabs po QHS x 2 nights, then increase to 3 tabs po QHS  Bipolar I disorder (HCC) -     QUEtiapine (SEROQUEL) 100 MG tablet; Take 1/2 tab po QHS x 1 night, then increase to 1 tab po QHS x 2 nights, then 2 tabs po QHS x 2 nights, then increase to 3 tabs po QHS  Insomnia due to other mental disorder -     QUEtiapine (SEROQUEL) 100 MG tablet; Take 1/2 tab po QHS x 1 night, then increase to 1 tab po QHS x 2 nights, then 2 tabs po QHS x 2 nights, then increase to 3 tabs po QHS -     traZODone (DESYREL) 100 MG tablet; TAKE 1 TO 1 & 1/2 (ONE TO ONE & ONE-HALF) TABLETS BY MOUTH EVERY DAY AT BEDTIME AS NEEDED FOR INSOMNIA     Please see After Visit Summary for patient specific instructions.  Future Appointments  Date Time Provider Department Center  07/27/2020 12:45 PM Corie Chiquito, PMHNP [facility] None    No orders of the defined types were placed in this encounter.   -------------------------------

## 2020-07-27 ENCOUNTER — Ambulatory Visit: Payer: BLUE CROSS/BLUE SHIELD | Admitting: Psychiatry

## 2020-08-17 ENCOUNTER — Ambulatory Visit: Payer: BLUE CROSS/BLUE SHIELD | Admitting: Psychiatry

## 2020-11-29 ENCOUNTER — Other Ambulatory Visit: Payer: Self-pay | Admitting: Psychiatry

## 2020-11-29 DIAGNOSIS — F319 Bipolar disorder, unspecified: Secondary | ICD-10-CM

## 2020-12-02 ENCOUNTER — Other Ambulatory Visit: Payer: Self-pay | Admitting: Psychiatry

## 2020-12-02 DIAGNOSIS — F319 Bipolar disorder, unspecified: Secondary | ICD-10-CM

## 2020-12-16 ENCOUNTER — Telehealth: Payer: Self-pay | Admitting: Family Medicine

## 2020-12-16 NOTE — Telephone Encounter (Signed)
Pt is calling in to see if Dr. Clent Ridges would be willing to prescribe divalproex (DEPAKOTE ER) 500 MG.  Pt would like to have a call back.

## 2020-12-17 NOTE — Telephone Encounter (Signed)
No this should come from Psychiatry

## 2020-12-17 NOTE — Telephone Encounter (Signed)
Advised patient to contact psychiatry for depakote.  He says he was dropped and I advised they should still help with medication until he is re established.

## 2022-06-04 ENCOUNTER — Other Ambulatory Visit: Payer: Self-pay

## 2022-06-04 ENCOUNTER — Encounter (HOSPITAL_COMMUNITY): Payer: Self-pay

## 2022-06-04 ENCOUNTER — Emergency Department (HOSPITAL_COMMUNITY): Payer: BLUE CROSS/BLUE SHIELD

## 2022-06-04 ENCOUNTER — Emergency Department (HOSPITAL_COMMUNITY)
Admission: EM | Admit: 2022-06-04 | Discharge: 2022-06-04 | Disposition: A | Discharge status: Home / Self Care | Payer: BLUE CROSS/BLUE SHIELD | Attending: Emergency Medicine | Admitting: Emergency Medicine

## 2022-06-04 DIAGNOSIS — Z23 Encounter for immunization: Secondary | ICD-10-CM | POA: Diagnosis not present

## 2022-06-04 DIAGNOSIS — S61051A Open bite of right thumb without damage to nail, initial encounter: Secondary | ICD-10-CM | POA: Diagnosis present

## 2022-06-04 DIAGNOSIS — W5501XA Bitten by cat, initial encounter: Secondary | ICD-10-CM | POA: Diagnosis not present

## 2022-06-04 DIAGNOSIS — J449 Chronic obstructive pulmonary disease, unspecified: Secondary | ICD-10-CM | POA: Diagnosis not present

## 2022-06-04 DIAGNOSIS — S50812A Abrasion of left forearm, initial encounter: Secondary | ICD-10-CM | POA: Diagnosis not present

## 2022-06-04 MED ORDER — AMOXICILLIN-POT CLAVULANATE 875-125 MG PO TABS: 1.0000 | ORAL_TABLET | Freq: Two times a day (BID) | ORAL | 0 refills | Status: AC

## 2022-06-04 MED ORDER — TETANUS-DIPHTH-ACELL PERTUSSIS 5-2.5-18.5 LF-MCG/0.5 IM SUSY
0.5000 mL | PREFILLED_SYRINGE | Freq: Once | INTRAMUSCULAR | Status: AC
  Administered 2022-06-04: 0.5 mL via INTRAMUSCULAR
  Filled 2022-06-04: qty 0.5

## 2022-06-04 MED ORDER — AMOXICILLIN-POT CLAVULANATE 875-125 MG PO TABS
1.0000 | ORAL_TABLET | Freq: Once | ORAL | Status: AC
  Administered 2022-06-04: 1 via ORAL
  Filled 2022-06-04: qty 1

## 2022-06-04 NOTE — Discharge Instructions (Addendum)
Take Augmentin twice daily for 7 days.  Wash the hands with soap and water.  She take Tylenol or Motrin or both for pain.  If you develop significant pain with movement of your thumb, swelling, spreading redness or pus or fevers or signs of infection you should return back to ED for additional evaluation.

## 2022-06-04 NOTE — ED Provider Notes (Signed)
[facility] EMERGENCY DEPARTMENT Provider Note   CSN: [id number] Arrival date & time: 06/04/22  2018     History  Chief Complaint  Patient presents with   Animal Bite    David [name] is a 31 y.o. male.   Animal Bite   Patient with medical history of bipolar disorder, OCD and allergies presents today due to cat bite.  This happened earlier today, the cat was his own and is up-to-date on vaccines.  He was bit to the right thumb and has a puncture wound there.  Also scratch to left hand.  Last tetanus was when he was 21 so roughly 10 years ago.  He denies any paresthesias, no pain with movement.  Not having any fevers.  Home Medications Prior to Admission medications   Medication Sig Start Date End Date Taking? Authorizing Provider  amoxicillin-clavulanate (AUGMENTIN) 875-125 MG tablet Take 1 tablet by mouth every 12 (twelve) hours. 06/04/22  Yes Theron Arista, PA-C  busPIRone (BUSPAR) 30 MG tablet Take 1 tablet (30 mg total) by mouth 2 (two) times daily. 06/29/20 09/27/20  Corie Chiquito, PMHNP  divalproex (DEPAKOTE ER) 500 MG 24 hr tablet Take 4 tablets (2,000 mg total) by mouth at bedtime. 06/29/20 09/27/20  Corie Chiquito, PMHNP  propranolol (INDERAL) 10 MG tablet Take 1-2 tabs po BID prn anxiety 05/22/20   Corie Chiquito, PMHNP  QUEtiapine (SEROQUEL) 100 MG tablet Take 1/2 tab po QHS x 1 night, then increase to 1 tab po QHS x 2 nights, then 2 tabs po QHS x 2 nights, then increase to 3 tabs po QHS 06/29/20   Corie Chiquito, PMHNP  traZODone (DESYREL) 100 MG tablet TAKE 1 TO 1 & 1/2 (ONE TO ONE & ONE-HALF) TABLETS BY MOUTH EVERY DAY AT BEDTIME AS NEEDED FOR INSOMNIA 06/29/20   Corie Chiquito, PMHNP      Allergies    Patient has no known allergies.    Review of Systems   Review of Systems  Physical Exam Updated Vital Signs BP 127/77   Pulse 88   Temp 97.8 F (36.6 C) (Oral)   Resp 17   Ht 5\' 9"  (1.753 m)   Wt 74.8 kg   SpO2 100%   BMI 24.37 kg/m  Physical Exam Vitals  and nursing note reviewed. Exam conducted with a chaperone present.  Constitutional:      General: He is not in acute distress.    Appearance: Normal appearance.  HENT:     Head: Normocephalic and atraumatic.  Eyes:     General: No scleral icterus.    Extraocular Movements: Extraocular movements intact.     Pupils: Pupils are equal, round, and reactive to light.  Cardiovascular:     Pulses: Normal pulses.  Musculoskeletal:        General: No swelling or tenderness. Normal range of motion.     Comments: Full ROM to each digit, no abduction adduction of thumb  Skin:    Capillary Refill: Capillary refill takes less than 2 seconds.     Coloration: Skin is not jaundiced.     Comments: 3 puncture wounds to dorsal aspect of right thumb.  Linear abrasions to left forearm  Neurological:     Mental Status: He is alert. Mental status is at baseline.     Coordination: Coordination normal.     Comments: Sensation to light touch is grossly intact circumferentially around each upper extremity     ED Results / Procedures / Treatments   Labs (all labs ordered  are listed, but only abnormal results are displayed) Labs Reviewed - No data to display  EKG None  Radiology DG Finger Thumb Right  Result Date: 06/04/2022 CLINICAL DATA:  Right thumb injury. A cat bit his thumb, right hand. EXAM: RIGHT THUMB 2+V COMPARISON:  None Available. FINDINGS: There is no evidence of fracture or dislocation. There is no evidence of arthropathy or other focal bone abnormality. Mild soft tissue swelling is noted but there is no visible foreign body or soft tissue gas. IMPRESSION: Soft tissue swelling, without acute underlying bone abnormality. Electronically Signed   By: Almira Bar M.D.   On: 06/04/2022 21:24    Procedures Procedures    Medications Ordered in ED Medications  Tdap (BOOSTRIX) injection 0.5 mL (0.5 mLs Intramuscular Given 06/04/22 2104)  amoxicillin-clavulanate (AUGMENTIN) 875-125 MG per tablet  1 tablet (1 tablet Oral Given 06/04/22 2109)    ED Course/ Medical Decision Making/ A&P                           Medical Decision Making Amount and/or Complexity of Data Reviewed Radiology: ordered.  Risk Prescription drug management.   Patient presents due to cat bite.    Cat is up-to-date on vaccinations, rabies not indicated.  \\  He is neurovascular intact with brisk cap refill and good ROM.  There are 3 puncture wounds to the right thumb, not actively bleeding.  I do not appreciate any retained foreign bodies.  Wound was irrigated copiously, tetanus updated and first dose of Augmentin given here in ED.  Discussed return precautions, will provide hand follow-up.          Final Clinical Impression(s) / ED Diagnoses Final diagnoses:  Cat bite, initial encounter    Rx / DC Orders ED Discharge Orders          Ordered    amoxicillin-clavulanate (AUGMENTIN) 875-125 MG tablet  Every 12 hours        06/04/22 2104              Theron Arista, PA-C 06/04/22 2226    Terrilee Files, MD 06/05/22 1108

## 2022-06-04 NOTE — ED Triage Notes (Signed)
Pt bitten by his cat on right thumb and scratches on left hand.  Cat is vaccinated

## 2023-02-14 ENCOUNTER — Telehealth: Payer: Self-pay | Admitting: Family Medicine

## 2023-02-14 NOTE — Telephone Encounter (Signed)
Pt called to request a referral to see a pain management doctor.   LOV:  05/05/2020  Please advise.

## 2023-02-15 NOTE — Telephone Encounter (Addendum)
Appointment required for referral.   Last OV 05/05/2020.    Called [phone] only number that is listed for patient, non working number.    Called patient contacts that were listed, was informed wrong number also no answer on other number

## 2023-02-17 NOTE — Telephone Encounter (Signed)
Left detailed message for pt to call the office and schedule appointment with Dr Sarajane Jews LOV was 2021

## 2023-08-11 ENCOUNTER — Ambulatory Visit (HOSPITAL_COMMUNITY)
Admission: EM | Admit: 2023-08-11 | Discharge: 2023-08-11 | Disposition: A | Discharge status: Home / Self Care | Payer: Self-pay | Attending: Urology | Admitting: Urology

## 2023-08-11 DIAGNOSIS — F319 Bipolar disorder, unspecified: Secondary | ICD-10-CM | POA: Insufficient documentation

## 2023-08-11 MED ORDER — DIVALPROEX SODIUM ER 500 MG PO TB24: 2000.0000 mg | ORAL_TABLET | Freq: Every day | ORAL | 1 refills | Status: AC

## 2023-08-11 NOTE — Discharge Instructions (Addendum)

## 2023-08-11 NOTE — ED Provider Notes (Signed)
Behavioral [facility] Urgent Care Medical Screening Exam  Patient Name: David [name] MRN: [id number] Date of Evaluation: 08/11/23 Chief Complaint:   Diagnosis:  Final diagnoses:  Bipolar I disorder (HCC)    History of Present illness: David [name] is a 32 y.o. male. ***  Flowsheet Row ED from 08/11/2023 in [facility] ED from 06/04/2022 in [facility] Emergency Department at [facility]  C-SSRS RISK CATEGORY No Risk No Risk       Psychiatric Specialty Exam  Presentation  General Appearance:Appropriate for Environment  Eye Contact:Good  Speech:Clear and Coherent  Speech Volume:Normal  Handedness:Right   Mood and Affect  Mood: Angry  Affect: Congruent   Thought Process  Thought Processes: Coherent  Descriptions of Associations:Intact  Orientation:Full (Time, Place and Person)  Thought Content:WDL    Hallucinations:None  Ideas of Reference:None  Suicidal Thoughts:No  Homicidal Thoughts:No   Sensorium  Memory: Immediate Good; Remote Good; Recent Good  Judgment: Good  Insight: Good   Executive Functions  Concentration: Good  Attention Span: Good  Recall: Good  Fund of Knowledge: Good  Language:No data recorded  Psychomotor Activity  Psychomotor Activity: Normal   Assets  Assets: Communication Skills; Desire for Improvement; Social Support; Housing; Physical Health   Sleep  Sleep: Fair  Number of hours: No data recorded  Physical Exam: Physical Exam ROS Blood pressure (!) 161/93, pulse 95, temperature (!) 96.7 F (35.9 C), temperature source Oral, resp. rate 19, SpO2 100%. There is no height or weight on file to calculate BMI.  Musculoskeletal: Strength & Muscle Tone: {desc; muscle tone:32375} Gait & Station: {PE GAIT ED NATL:22525} Patient leans: {Patient Leans:21022755}   [facility] MSE Discharge Disposition for Follow up and Recommendations: {[facility] MSE Recommendations:24277}   Salvatore Shear A  Kassey Laforest, NP 08/11/2023, 8:22 PM

## 2023-08-11 NOTE — Progress Notes (Signed)
   08/11/23 1726  [facility] Triage Screening (Walk-ins at [facility] only)  How Did You Hear About [place]? Family/Friend  What Is the Reason for Your Visit/Call Today? Pt presents to [facility] accompanied by his mother. Pt reports that he is in need of a refil of Depakote. Pt states, "I feel like I am having manic behaviors here and there because I have been out of my medication from 5-7days." Pt reports to also have depression, PTSD, and Bipolar 1. Pt reports that he has had seziures in the past. Pt states, "this is an emergency need of my medication because I have no other option to get my refil." Pt reported that he went to the [facility] but they were unable to take him due to financial reasons. Pt reports that he sees a therpaist on occasions, but no longer sees him frequently due to finances. Pt denies substance use, SI, HI and AVH.  How Long Has This Been Causing You Problems? <Week  Have You Recently Had Any Thoughts About Hurting Yourself? No  Are You Planning to Commit Suicide/Harm Yourself At This time? No  Have you Recently Had Thoughts About Hurting Someone Karolee Ohs? No  Are You Planning To Harm Someone At This Time? No  Are you currently experiencing any auditory, visual or other hallucinations? No  Have You Used Any Alcohol or Drugs in the Past 24 Hours? No  Do you have any current medical co-morbidities that require immediate attention? No  Clinician description of patient physical appearance/behavior: well groomed,  What Do You Feel Would Help You the Most Today? Stress Management;Medication(s)  If access to [facility] was not available, would you have sought care in the Emergency Department? No  Determination of Need Routine (7 days)  Options For Referral Medication Management;Outpatient Therapy

## 2023-10-18 ENCOUNTER — Encounter: Payer: Self-pay | Admitting: Psychiatry

## 2023-11-09 ENCOUNTER — Ambulatory Visit (HOSPITAL_COMMUNITY)
Admission: EM | Admit: 2023-11-09 | Discharge: 2023-11-09 | Disposition: A | Discharge status: Home / Self Care | Payer: Self-pay | Attending: Behavioral Health | Admitting: Behavioral Health

## 2023-11-09 DIAGNOSIS — Z760 Encounter for issue of repeat prescription: Secondary | ICD-10-CM | POA: Insufficient documentation

## 2023-11-09 DIAGNOSIS — Z79899 Other long term (current) drug therapy: Secondary | ICD-10-CM | POA: Insufficient documentation

## 2023-11-09 DIAGNOSIS — F429 Obsessive-compulsive disorder, unspecified: Secondary | ICD-10-CM | POA: Insufficient documentation

## 2023-11-09 DIAGNOSIS — F319 Bipolar disorder, unspecified: Secondary | ICD-10-CM | POA: Insufficient documentation

## 2023-11-09 NOTE — ED Provider Notes (Addendum)
Behavioral Health Urgent Care Medical Screening Exam  Patient Name: David [name] MRN: [id number] Date of Evaluation: 11/09/23 Chief Complaint:   Diagnosis:  Final diagnoses:  Encounter for medication refill  Bipolar 1 disorder (HCC)    History of Present illness: David [name] is a 32 y.o. male patient with a past psychiatric history significant for bipolar and OCD who presents to the [facility] behavioral health urgent care voluntary requesting a medication refill for Depakote.  Patient seen and evaluated face-to-face by this provider, chart reviewed and case discussed with Dr. Enedina [name].  On evaluation, patient is alert and oriented x 4. His thought process is linear and speech is clear and coherent. His mood is euthymic and affect is congruent. He denies SI/HI/AVH. There is no objective evidence that the patient is currently responding to internal or external stimuli. He denies depressive or manic symptoms at this time.   Patient states that he has not been able to get his medication refilled at the [facility] because he owes money and has to pay before he can get an appointment. He reports taking Depakote 2000 mg at bedtime. He states that he ran out of the Depakote 5 days ago. He states that his medication was refilled the last time he was here at the [facility].  Per chart review on 08/11/23, patient presented with similar complaints and Depakote 2000 mg at bedtime was refilled x 30 days, no refill and patient was recommended to follow up with outpatient psychiatry for medication management. In addition, last valproic level assessed was on 05/05/20, elevated at 116.6 according the EMR.   Plan:  Given the patient's history, information provided on exam and noncompliance with outpatient follow up for medication management, it would be most beneficial for the patient to establish outpatient psychiatry to get restarted back on Depakote and titrated back to last therapeutic dosage. Patient  will also require valproic level to be checked on an outpatient basis. Patient provided with outpatient resources for psychiatry for medication management and therapy. Patient also requested resources for shelter and was provided with a list of local shelters. Patient to return to the [facility] if symptoms of depression, mania or mood swings worsen. Patient safely discharged.    Flowsheet Row ED from 11/09/2023 in [facility] ED from 08/11/2023 in [facility] ED from 06/04/2022 in [facility] Emergency Department at [facility]  C-SSRS RISK CATEGORY No Risk No Risk No Risk       Psychiatric Specialty Exam  Presentation  General Appearance:Appropriate for Environment  Eye Contact:Fair  Speech:Clear and Coherent  Speech Volume:Normal  Handedness:Right   Mood and Affect  Mood: Euthymic  Affect: Congruent   Thought Process  Thought Processes: Coherent  Descriptions of Associations:Intact  Orientation:Full (Time, Place and Person)  Thought Content:WDL    Hallucinations:None  Ideas of Reference:None  Suicidal Thoughts:No  Homicidal Thoughts:No   Sensorium  Memory: Immediate Fair; Recent Fair; Remote Fair  Judgment: Fair  Insight: Fair   Art therapist  Concentration: Fair  Attention Span: Fair  Recall: [employer] of Knowledge: Fair  Language: Fair   Psychomotor Activity  Psychomotor Activity: Normal   Assets  Assets: Manufacturing systems engineer; Desire for Improvement; Leisure Time; Physical Health; Resilience   Sleep  Sleep: Fair   Physical Exam: Physical Exam HENT:     Head: Normocephalic.     Nose: Nose normal.  Eyes:     Conjunctiva/sclera: Conjunctivae normal.  Cardiovascular:  Rate and Rhythm: Normal rate.  Pulmonary:     Effort: Pulmonary effort is normal.  Musculoskeletal:        General: Normal range of motion.  Neurological:     Mental Status: He is  alert and oriented to person, place, and time.    Review of Systems  Constitutional: Negative.   HENT: Negative.    Eyes: Negative.   Respiratory: Negative.    Cardiovascular: Negative.   Gastrointestinal: Negative.   Genitourinary: Negative.   Musculoskeletal: Negative.   Neurological: Negative.   Endo/Heme/Allergies: Negative.    Blood pressure (!) 152/93, pulse 94, temperature 98.5 F (36.9 C), temperature source Oral, resp. rate 17, SpO2 100%. There is no height or weight on file to calculate BMI.  Musculoskeletal: Strength & Muscle Tone: within normal limits Gait & Station: normal Patient leans: N/A   BHUC MSE Discharge Disposition for Follow up and Recommendations: Based on my evaluation the patient does not appear to have an emergency medical condition and can be discharged with resources and follow up care in outpatient services for Medication Management, Individual Therapy, and Group Therapy  Based on the information that you have provided and the presenting issues outpatient services and resources for have been recommended.  It is imperative that you follow through with treatment recommendations within 5-7 days from the of discharge to mitigate further risk to your safety and mental well-being. A list of referrals has been provided below to get you started.  You are not limited to the list provided.  In case of an urgent crisis, you may contact the Mobile Crisis Unit with [facility] at [phone].   [place] and [place] outpatient mental health and substance abuse resources   [facility] [address]  [place], [place] [address] [phone]  [facility] [address]  [place], [place] [address] [phone]  [facility] [address],  [place], [place] [address] [phone]  [facility] [address] [phone] phone [phone] fax  [facility] [address] [place],  [place], [place] [address] [phone]  [facility] [address]  [place], [place] [address] [phone]   [facility] [place] [address],  [place], [place] [address]  [phone]  [facility] [address] [place], [place] [address]  [phone]  Sofie Rower, [facility] [address] [place], [place] [address] [phone]   Lin Landsman Licensed Professional Counselor, [facility]  [facility] [address]  [place], [place] [address] [phone]  Dr. Monte Fantasia Pastoral Counselor/Therapist, PsyD, MDiv, [place]  [facility], [facility] [address]  [place], [place] [address] [phone]  [facility] [address]  [place], [place] [address] [phone] (Main Office) [phone] (Shift Lead) [phone])  [facility] [address] [place], [place] [address] Phone: [phone]  [facility] at [facility],  [address],  [place], [place] [address] [phone]  [facility] Address: [address] [place], [place] [address] Phone:    [phone]  [facility] Address: [address]  [place], [place] [address] Phone: [phone]  _________________  [facility] [address]  [place], [place] [address] [phone]  [facility] [address]  [place], [place] [address] [phone]  [facility] [address] [phone] phone [phone] fax [phone] Coordinator  [facility] [address] [place], [place] [address]  [phone]  [facility] [address] [place], [place] [address] [phone]  [facility] [address] [place], [place] [address] [phone]  [facility] Address: [address] [place], [place] [address] Phone: [phone]  The [facility] provides food, shelter and other programs and services to the homeless men of [place] through our [employer] program.  By offering safe shelter, three meals a day, clean clothing, Biblical counseling, financial planning, vocational training, GED/education and employment assistance, we've helped mend the shattered lives of many homeless men since opening in [place].  We have approximately 267 beds available, with a max of 312 beds including mats for emergency situations and currently house an average of 270 men a night.  Prospective Client Check-In Information Photo ID Required (State/ Out of State/ [facility]) - if photo ID is not available, clients are required to have a printout of a police/sheriff's criminal history report. Help out with chores around the [facility]. No sex offender of any type (pending, charged, registered and/or any other sex related offenses) will be permitted to check in. Must be willing to abide by all rules, regulations, and policies established by the [employer]. The following will be provided - shelter, food, clothing, and biblical counseling. If you or someone you know is in need of assistance at our [facility] shelter in [place], [place], please call [phone] ext. [phone]. [facility] [employer] hours are Monday-Friday only.      Layla Barter, NP 11/09/2023, 4:24 PM

## 2023-11-09 NOTE — ED Notes (Signed)
Patient discharged home by provider
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
## 2023-11-09 NOTE — Discharge Instructions (Addendum)
Based on the information that you have provided and the presenting issues outpatient services and resources for have been recommended.  It is imperative that you follow through with treatment recommendations within 5-7 days from the of discharge to mitigate further risk to your safety and mental well-being. A list of referrals has been provided below to get you started.  You are not limited to the list provided.  In case of an urgent crisis, you may contact the Mobile Crisis Unit with [facility] at [phone].   David [name] and  outpatient mental health and substance abuse resources   [facility] [address]  [place], [place] [address] [phone]  [facility] [address]  [place], [place] [address] [phone]  [facility] [address]  [place], [place] [address] [phone]  [facility] [address] [phone] phone [phone] fax  [facility] [address] [place],  [place], [place] [address] [phone]  [facility] [address]  [place], [place] [address] [phone]   [facility] [place] [address],  [place], [place] [address]  [phone]  [facility] [address] [place], [place] [address]  [phone]  Sofie Rower, [facility] [address] [place], [place] [address] [phone]   Lin Landsman Licensed Professional Counselor, [facility], [facility]  [facility] [address]  [place], [place] [address] [phone]  Dr. Monte Fantasia Pastoral Counselor/Therapist, PsyD, MDiv, [place]  [facility], [facility] [address]  [place], [place] [address] [phone]  [facility] [address]  [place], [place] [address] [phone] (Main Office) [phone]  (Shift Lead) [phone])  [place] [address],  [place], [place] [address] Phone: [phone]  [facility] at [facility],  [address],  [place], [place] [address] [phone]  [facility] Address: [address] [place], [place] [address] Phone:    [phone]  [facility] Address: [address]                    [place], [place] [address] Phone: [phone]  _________________  [facility] [address]  [place], [place] [address] [phone]  [facility] [address],  [place], [place] [address] [phone]  [facility] [address] [phone] phone [phone] fax [phone] [facility] Coordinator  [facility] [address] [place], [place] [address]  [phone]  [facility] [address] [place], [place] [address] [phone]  [facility] [address] [place], [place] [address] [phone]  [facility] Address: [address] [place], [place] [address] Phone: [phone]  The [name] provides food, shelter and other programs and services to the homeless men of [place] through our [employer] program.  By offering safe shelter, three meals a day, clean clothing, Biblical counseling, financial planning, vocational training, GED/education and employment assistance, we've helped mend the shattered lives of many homeless men since opening in 1974.  We have approximately 267 beds available, with a max  of 312 beds including mats for emergency situations and currently house an average of 270 men a night.  Prospective Client Check-In Information Photo ID Required (State/ Out of State/ [facility]) - if photo ID is not  available, clients are required to have a printout of a police/sheriff's criminal history report. Help out with chores around the [facility]. No sex offender of any type (pending, charged, registered and/or any other sex related offenses) will be permitted to check in. Must be willing to abide by all rules, regulations, and policies established by the [employer]. The following will be provided - shelter, food, clothing, and biblical counseling. If you or someone you know is in need of assistance at our [facility] shelter in [place], [place], please call [phone]. [facility] [employer] hours are Monday-Friday only.

## 2023-11-27 ENCOUNTER — Emergency Department (HOSPITAL_COMMUNITY)
Admission: EM | Admit: 2023-11-27 | Discharge: 2023-11-28 | Disposition: A | Discharge status: Home / Self Care | Payer: Self-pay | Attending: Emergency Medicine | Admitting: Emergency Medicine

## 2023-11-27 ENCOUNTER — Other Ambulatory Visit: Payer: Self-pay

## 2023-11-27 ENCOUNTER — Encounter (HOSPITAL_COMMUNITY): Payer: Self-pay | Admitting: *Deleted

## 2023-11-27 DIAGNOSIS — F319 Bipolar disorder, unspecified: Secondary | ICD-10-CM | POA: Diagnosis present

## 2023-11-27 DIAGNOSIS — R45851 Suicidal ideations: Secondary | ICD-10-CM | POA: Insufficient documentation

## 2023-11-27 DIAGNOSIS — F3131 Bipolar disorder, current episode depressed, mild: Secondary | ICD-10-CM | POA: Insufficient documentation

## 2023-11-27 DIAGNOSIS — Z5986 Financial insecurity: Secondary | ICD-10-CM | POA: Insufficient documentation

## 2023-11-27 HISTORY — DX: Post-traumatic stress disorder, unspecified: F43.10

## 2023-11-27 LAB — COMPREHENSIVE METABOLIC PANEL
ALT: 21 U/L (ref 0–44)
AST: 20 U/L (ref 15–41)
Albumin: 4.6 g/dL (ref 3.5–5.0)
Alkaline Phosphatase: 45 U/L (ref 38–126)
Anion gap: 9 (ref 5–15)
BUN: 9 mg/dL (ref 6–20)
CO2: 29 mmol/L (ref 22–32)
Calcium: 8.9 mg/dL (ref 8.9–10.3)
Chloride: 98 mmol/L (ref 98–111)
Creatinine, Ser: 0.95 mg/dL (ref 0.61–1.24)
GFR, Estimated: >60 mL/min (ref >60)
Glucose, Bld: 89 mg/dL (ref 70–99)
Potassium: 3.3 mmol/L — ABNORMAL LOW (ref 3.5–5.1)
Sodium: 136 mmol/L (ref 135–145)
Total Bilirubin: 0.5 mg/dL (ref <1.2)
Total Protein: 7.6 g/dL (ref 6.5–8.1)

## 2023-11-27 LAB — CBC WITH DIFFERENTIAL/PLATELET
Abs Immature Granulocytes: 0.01 10*3/uL (ref 0.00–0.07)
Basophils Absolute: 0 10*3/uL (ref 0.0–0.1)
Basophils Relative: 1 %
Eosinophils Absolute: 0 10*3/uL (ref 0.0–0.5)
Eosinophils Relative: 1 %
HCT: 36.7 % — ABNORMAL LOW (ref 39.0–52.0)
Hemoglobin: 13.1 g/dL (ref 13.0–17.0)
Immature Granulocytes: 0 %
Lymphocytes Relative: 47 %
Lymphs Abs: 3.8 10*3/uL (ref 0.7–4.0)
MCH: 31.4 pg (ref 26.0–34.0)
MCHC: 35.7 g/dL (ref 30.0–36.0)
MCV: 88 fL (ref 80.0–100.0)
Monocytes Absolute: 0.6 10*3/uL (ref 0.1–1.0)
Monocytes Relative: 8 %
Neutro Abs: 3.4 10*3/uL (ref 1.7–7.7)
Neutrophils Relative %: 43 %
Platelets: 263 10*3/uL (ref 150–400)
RBC: 4.17 MIL/uL — ABNORMAL LOW (ref 4.22–5.81)
RDW: 11.9 % (ref 11.5–15.5)
WBC: 7.9 10*3/uL (ref 4.0–10.5)
nRBC: 0 % (ref 0.0–0.2)

## 2023-11-27 LAB — ETHANOL: Alcohol, Ethyl (B): <10 mg/dL (ref <10)

## 2023-11-27 MED ORDER — NICOTINE 21 MG/24HR TD PT24
21.0000 mg | MEDICATED_PATCH | Freq: Once | TRANSDERMAL | Status: AC
  Administered 2023-11-27: 21 mg via TRANSDERMAL
  Filled 2023-11-27: qty 1

## 2023-11-27 MED ORDER — LORAZEPAM 0.5 MG PO TABS
0.5000 mg | ORAL_TABLET | Freq: Once | ORAL | Status: AC
  Administered 2023-11-27: 0.5 mg via ORAL
  Filled 2023-11-27: qty 1

## 2023-11-27 MED ORDER — DIVALPROEX SODIUM ER 500 MG PO TB24
2000.0000 mg | ORAL_TABLET | Freq: Every day | ORAL | Status: DC
  Administered 2023-11-27: 2000 mg via ORAL
  Filled 2023-11-27 (×2): qty 4

## 2023-11-27 NOTE — ED Provider Notes (Signed)
[place] EMERGENCY DEPARTMENT AT [facility] Provider Note   CSN: [id number] Arrival date & time: 11/27/23  1047     History  Chief Complaint  Patient presents with   V70.1    David [name] is a 32 y.o. male.  HPI   Patient presents with concern of suicidal thoughts.  He has a history of psychiatric disease, seizures, has not had access to his medications for about 2 weeks.  He notes that his suicidal thoughts began prior to that, but over the past day has become worse.  Though he has a plan to kill himself, he states that he has some insight, and sold his handgun.  With persistent/perseverant thoughts on killing himself he presents for evaluation.  No physical pain beyond chronic hip pain, unchanged.  Home Medications Prior to Admission medications   Medication Sig Start Date End Date Taking? Authorizing Provider  amoxicillin-clavulanate (AUGMENTIN) 875-125 MG tablet Take 1 tablet by mouth every 12 (twelve) hours. 06/04/22   Theron Arista, PA-C  busPIRone (BUSPAR) 30 MG tablet Take 1 tablet (30 mg total) by mouth 2 (two) times daily. 06/29/20 09/27/20  Corie Chiquito, PMHNP  divalproex (DEPAKOTE ER) 500 MG 24 hr tablet Take 4 tablets (2,000 mg total) by mouth at bedtime. 08/11/23 11/09/23  Cecilio Asper A, NP  propranolol (INDERAL) 10 MG tablet Take 1-2 tabs po BID prn anxiety 05/22/20   Corie Chiquito, PMHNP  QUEtiapine (SEROQUEL) 100 MG tablet Take 1/2 tab po QHS x 1 night, then increase to 1 tab po QHS x 2 nights, then 2 tabs po QHS x 2 nights, then increase to 3 tabs po QHS 06/29/20   Corie Chiquito, PMHNP  traZODone (DESYREL) 100 MG tablet TAKE 1 TO 1 & 1/2 (ONE TO ONE & ONE-HALF) TABLETS BY MOUTH EVERY DAY AT BEDTIME AS NEEDED FOR INSOMNIA 06/29/20   Corie Chiquito, PMHNP      Allergies    Patient has no known allergies.    Review of Systems   Review of Systems  Physical Exam Updated Vital Signs BP (!) 142/77 (BP Location: Left Arm)   Pulse (!) 103   Temp 97.6 F  (36.4 C) (Oral)   Resp 17   Ht 5\' 9"  (1.753 m)   Wt 68 kg   SpO2 97%   BMI 22.15 kg/m  Physical Exam Vitals and nursing note reviewed.  Constitutional:      General: He is not in acute distress.    Appearance: He is well-developed.  HENT:     Head: Normocephalic and atraumatic.  Eyes:     Conjunctiva/sclera: Conjunctivae normal.  Cardiovascular:     Rate and Rhythm: Normal rate and regular rhythm.  Pulmonary:     Effort: Pulmonary effort is normal. No respiratory distress.     Breath sounds: No stridor.  Abdominal:     General: There is no distension.  Skin:    General: Skin is warm and dry.  Neurological:     Mental Status: He is alert and oriented to person, place, and time.  Psychiatric:        Mood and Affect: Affect is blunt.        Thought Content: Thought content includes suicidal ideation.        Cognition and Memory: Cognition is not impaired. Memory is not impaired.     ED Results / Procedures / Treatments   Labs (all labs ordered are listed, but only abnormal results are displayed) Labs Reviewed  COMPREHENSIVE METABOLIC  PANEL - Abnormal; Notable for the following components:      Result Value   Potassium 3.3 (*)    All other components within normal limits  CBC WITH DIFFERENTIAL/PLATELET - Abnormal; Notable for the following components:   RBC 4.17 (*)    HCT 36.7 (*)    All other components within normal limits  ETHANOL  RAPID URINE DRUG SCREEN, HOSP PERFORMED    EKG None  Radiology No results found.  Procedures Procedures    Medications Ordered in ED Medications  nicotine (NICODERM CQ - dosed in mg/24 hours) patch 21 mg (21 mg Transdermal Patch Applied 11/27/23 1219)    ED Course/ Medical Decision Making/ A&P                                 Medical Decision Making Adult male with history of psychiatric disease, now homeless presents with suicidal ideation.  Patient is awake, alert, somewhat insightful, but has elevated risk profile  for suicide risk.  Patient medically cleared for behavioral health evaluation.  Amount and/or Complexity of Data Reviewed Labs: ordered. Decision-making details documented in ED Course.  Risk OTC drugs. Prescription drug management. Decision regarding hospitalization. Diagnosis or treatment significantly limited by social determinants of health.           Final Clinical Impression(s) / ED Diagnoses Final diagnoses:  Suicidal ideation    Rx / DC Orders ED Discharge Orders     None         Gerhard Munch, MD 11/27/23 1311

## 2023-11-27 NOTE — ED Provider Notes (Signed)
Patient here with SI. Per Dr. Enedina Finner psychiatry, patient is recommended inpatient hospitalization and per note, "please IVC if attempts to leave the hospital." Patient states he would like to leave and does not want to stay overnight. He will be IVC'd per psychiatry recommendations.    Loetta Rough, MD 11/27/23 [phone]

## 2023-11-27 NOTE — ED Triage Notes (Signed)
Pt states he feels suicidal and has felt that way for the last month  Pt states he has had some increase in stressors  Pt states his plan would be to shoot himself
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
## 2023-11-28 DIAGNOSIS — Z59869 Financial insecurity, unspecified: Secondary | ICD-10-CM | POA: Insufficient documentation

## 2023-11-28 DIAGNOSIS — F3132 Bipolar disorder, current episode depressed, moderate: Secondary | ICD-10-CM

## 2023-11-28 DIAGNOSIS — Z5986 Financial insecurity: Secondary | ICD-10-CM | POA: Insufficient documentation

## 2023-11-28 LAB — RAPID URINE DRUG SCREEN, HOSP PERFORMED
Amphetamines: NOT DETECTED
Barbiturates: NOT DETECTED
Benzodiazepines: NOT DETECTED
Cocaine: NOT DETECTED
Opiates: NOT DETECTED
Tetrahydrocannabinol: NOT DETECTED

## 2023-11-28 LAB — VALPROIC ACID LEVEL: Valproic Acid Lvl: 67 ug/mL (ref 50.0–100.0)

## 2023-11-28 MED ORDER — DIVALPROEX SODIUM ER 500 MG PO TB24
2000.0000 mg | ORAL_TABLET | Freq: Every day | ORAL | Status: DC
  Filled 2023-11-28: qty 4

## 2023-11-28 NOTE — ED Notes (Signed)
CSW was consulted for outpatient information FOR [facility] for med mgmt. CSW added [facility] contact number with instructions on how to get medication management appointment. TOC signing off.

## 2023-11-28 NOTE — Consult Note (Cosign Needed Addendum)
[facility] Psychiatric Consult Initial  Patient Name: .David [name]  MRN: [id number]  DOB: 30-Nov-1991  Consult Order details:  Orders (From admission, onward)     Start     Ordered   11/27/23 1145  CONSULT TO CALL ACT TEAM       Ordering Provider: Gerhard Munch, MD  Provider:  (Not yet assigned)  Question:  Reason for Consult?  Answer:  Psych consult   11/27/23 1145             Mode of Visit: Tele-visit Virtual Statement:TELE PSYCHIATRY ATTESTATION & CONSENT As the provider for this telehealth consult, I attest that I verified the patient's identity using two separate identifiers, introduced myself to the patient, provided my credentials, disclosed my location, and performed this encounter via a HIPAA-compliant, real-time, face-to-face, two-way, interactive audio and video platform and with the full consent and agreement of the patient (or guardian as applicable.) Patient physical location: [facility] Emergency Department. Telehealth provider physical location: home office in state of [place].   Video start time: 1116 Video end time: 1210    Psychiatry Consult Evaluation  Service Date: November 28, 2023 LOS:  LOS: 0 days  Chief Complaint "I am not suicidal and I don't want to kill myself, I'm just really poor and needing help with medications."   Primary Psychiatric Diagnoses  Bipolar Disorder 2.  Financially Insecure   Assessment  David [name] is a 32 y.o. male admitted: Presented to the EDfor 11/27/2023 11:14 AM for evaluation of suicidal thoughts and medication non-adherence d/t financial insecurity. He carries the psychiatric diagnoses of Bipolar Disorder and OCD and has a past medical history of  Chronic Neck Pain.   His current presentation of suicidal thoughts is most consistent with his hx or depression and bipolar disorder. However since he was restarted on mood stabilizer depakote, his symptoms have improved. He does not meet criteria for psychiatric admission based on  above.  Current outpatient psychotropic medications include depakote, buspar, quetiapine and historically he has had a good response to these medications. He was non compliant with medications prior to admission as evidenced by self reports and mood instability.   On initial examination, valuation Cabell Thebo is observed sitting on a chair in private room; He is alert/oriented x 4; calm/cooperative; and mood congruent with affect.  Patient is speaking in a clear tone at moderate volume, and normal pace; with good eye contact.  His thought process is coherent and relevant; There is no indication that he is currently responding to internal/external stimuli or experiencing delusional thought content.  Patient's previously reported suicidal thoughts have abated with restarting mood stabilizing meds. He denies homicidal ideation, psychosis, and paranoia.  Patient has remained calm throughout assessment and has answered questions appropriately.   Diagnoses:  Active Hospital problems: Principal Problem:   Bipolar disorder (HCC) Active Problems:   Financially insecure    Plan   ## Psychiatric Medication Recommendations:  Continue home medication of - Depakote DR 2000mg  po at bedtime;  Buspar 30mg  po daily Quetiapine 300mg  po at bedtime  Trazodone 100-150mg  po at bedtime prn insomnia Propranolol 10mg  tabs 1-2 tab po BID prn anxiety   ## Medical Decision Making Capacity:  Patient is his own legal guardian.   ## Further Work-up:  EKG -- most recent EKG on 06/27/2018 had QtC of 436 -- Pertinent labwork reviewed earlier this admission includes: CMP, CBC, UDS Valproic Acid levels 67, which was WNL to continue Depakote   ## Disposition:-- Plan Post Discharge/Psychiatric  Care Follow-up resources The ED provider assisted in getting patient a few days supply of depakote to last until he can follow up with [facility] outpatient clinic on Friday.   ## Behavioral / Environmental: - No specific recommendations  at this time.     ## Safety and Observation Level:  - Based on my clinical evaluation, I estimate the patient to be at low risk of self harm in the current setting. - At this time, we recommend  routine. This decision is based on my review of the chart including patient's history and current presentation, interview of the patient, mental status examination, and consideration of suicide risk including evaluating suicidal ideation, plan, intent, suicidal or self-harm behaviors, risk factors, and protective factors. This judgment is based on our ability to directly address suicide risk, implement suicide prevention strategies, and develop a safety plan while the patient is in the clinical setting. Please contact our team if there is a concern that risk level has changed.  CSSR Risk Category:C-SSRS RISK CATEGORY: Moderate Risk  Suicide Risk Assessment: Patient has following modifiable risk factors for suicide: under treated depression  and medication noncompliance d/t financial insecurity, which we are addressing by providing patient with 3 days supply of depakote and referred to [facility] for continued outpatient medication management.  Also recommended her continue his plan to apply for medicaid. Patient has recent med refills on 12/23 of all med except depakote. Patient has following non-modifiable or demographic risk factors for suicide: male gender Patient has the following protective factors against suicide: Access to outpatient mental health care, Supportive family, no history of suicide attempts, and no history of NSSIB  Thank you for this consult request. Recommendations have been communicated to the primary team.  We will recommend continued outpatient care  at this time.   Chales Abrahams, NP       History of Present Illness  Relevant Aspects of Hospital ED Course:  Admitted on 11/27/2023 for suicidal ideations and medication non-compliance (d/t financial insecurity)  Per ED Provider  Admission Assessment 11/06/2023  Chief Complaint  Patient presents with   V70.1   David [name] is a 32 y.o. male.   HPI   Patient presents with concern of suicidal thoughts.  He has a history of psychiatric disease, seizures, has not had access to his medications for about 2 weeks.  He notes that his suicidal thoughts began prior to that, but over the past day has become worse.  Though he has a plan to kill himself, he states that he has some insight, and sold his handgun.  With persistent/perseverant thoughts on killing himself he presents for evaluation.  No physical pain beyond chronic hip pain, unchanged.    Patient Report: 11/28/2023 Patient states since starting his medications, "I'm feeling fine I just needed to restart my medications."  Patient states, "I am not suicidal and I don't want to kill myself, I'm just really poor and needing help with medications." He reports a hx for seizures and states he's been out of his medications for the past few months, cites financial insecurity.  States he does not have insurance but has plans to apply for medicaid.  Patient reports he came to the hospital because he wanted to "be responsible and restart my medications."  When asked about giving away his firearms, he states this occurred 5 years ago and was not recent.  He states he has no desire to kill himself and has never attempted suicide.  He does admit to  passive suicidal thoughts that are chronic, no new or worse today.  States since restarting depakote, his overall mood has improved and he pervasive SI has abated as well.     He states he is not established with outpatient psychiatry but used to see [facility],last visit was about 5 years ago.  He discusses his plan to reconnect for talk therapy. Patient gave permission for this writer to talk with his mother for collateral  Psych ROS:  Depression: yes Anxiety:  denies Mania (lifetime and current): denies Psychosis: (lifetime and current):  hx of talking to himself   Collateral information:  Per Collateral from Nurse: The nurse informed this Clinical research associate that she overheard patient's telephone call to his mother.  She states patient was warning his mother to expect a call from mental health, "be in a good mood" and that "they are going to ask you some questions about if I am crazy or not, just tell them I am not so I can go home".   Per collateral form patient's mother: Who reports prior to admission, she went to get an oil change yesterday and when she returned home patient said he wanted to go to the hospital.  She states patient has a twin brother whom she states triggered patient.  She reports both patient and his brother are both bipolar and pt's brother is "in crisis mode and I am about to have him committed."  She believes Adarrius's brother's behavior triggered him to seek help.  She states one day prior to admission, she did hear patient talking loudly in his room; when she opened the door, no one else was there.  She states this is common for patient when he becomes stressed.  Regarding patient giving firearms away, she states they have not had firearms in the home for at least 4 years.  States she got rid of them after her divorce.  She states Styles does not have firearms and has no access to firearms.  She states her son has never attempted suicide and she has no safety concerns with him being discharged.  She describes patient as, "Thadius is my protector ad he's my responsible child."   I did ask her about patient's phone call to her earlier. She admits he did call her.  She states he asked her to be nice  because he knew she was in a bad mood by her tone on the phone. She reiterates she is having a difficult time with patient's brother but does not have concerns with Rishit.   She reiterates her belief that patient would not do anything to hurt himself or anyone else.   Review of Systems  Constitutional: Negative.   HENT: Negative.     Eyes: Negative.   Respiratory: Negative.    Cardiovascular: Negative.   Gastrointestinal: Negative.   Genitourinary: Negative.   Musculoskeletal: Negative.   Skin: Negative.   Neurological: Negative.   Endo/Heme/Allergies: Negative.   Psychiatric/Behavioral:  Positive for depression. Negative for hallucinations, substance abuse and suicidal ideas. The patient is not nervous/anxious.      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 Living Situation: lives at home with his mother and twin brother Spiritual Hx: denies Access to weapons/lethal means: denies   Substance History Alcohol: denies  Type of alcohol denies Last Drink n/a Number of drinks per day n/a History of alcohol withdrawal seizures n/a History of DT's n/a Tobacco: denies Illicit drugs: denies Prescription drug abuse: denies Rehab hx: denies  Exam Findings  Physical Exam: as outlined below Vital Signs:  Temp:  [97.7 F (36.5 C)] 97.7 F (36.5 C) (12/24 1123) Pulse Rate:  [87] 87 (12/24 1038) Resp:  [18] 18 (12/24 1038) BP: (102)/(75) 102/75 (12/24 1038) SpO2:  [99 %] 99 % (12/24 1038) Blood pressure 102/75, pulse 87, temperature 97.7 F (36.5 C), temperature source Oral, resp. rate 18, height 5\' 9"  (1.753 m), weight 68 kg, SpO2 99%. Body mass index is 22.15 kg/m.  Physical Exam Constitutional:      Appearance: Normal  appearance.  Cardiovascular:     Rate and Rhythm: Normal rate.     Pulses: Normal pulses.  Pulmonary:     Effort: Pulmonary effort is normal.  Musculoskeletal:        General: Normal range of motion.     Cervical back: Normal range of motion.  Neurological:     Mental Status: He is alert and oriented to person, place, and time. Mental status is at baseline.  Psychiatric:        Attention and Perception: Attention and perception normal.        Mood and Affect: Mood and affect normal.        Speech: Speech normal.        Cognition and Memory: Cognition and memory normal.        Judgment: Judgment normal.     Mental Status Exam: General Appearance: Casual  Orientation:  Full (Time, Place, and Person)  Memory:  Immediate;   Good Recent;   Good Remote;   Good  Concentration:  Concentration: Good and Attention Span: Good  Recall:  Good  Attention  Good  Eye Contact:  Good  Speech:  Clear and Coherent and Normal Rate  Language:  Good  Volume:  Normal  Mood: "Im better today."  Affect:  Congruent  Thought Process:  Coherent  Thought Content:  Logical  Suicidal Thoughts:  No  Homicidal Thoughts:  No  Judgement:  Good  Insight:  Fair  Psychomotor Activity:  Normal  Akathisia:  No  Fund of Knowledge:  Good      Assets:  Communication Skills Desire for Improvement Housing Social Support  Cognition:  WNL  ADL's:  Intact  AIMS (if indicated):        Other History   These have been pulled in through the EMR, reviewed, and updated if appropriate.  Family History:  The patient's family history includes Alcohol abuse in his father; Anxiety disorder in his father; Bipolar disorder in his mother; Depression in his brother; Drug abuse in his brother; Fibromyalgia in his brother; Personality disorder in his brother and mother.  Medical History: Past Medical History:  Diagnosis Date   Allergy    Bipolar disorder (HCC)    sees Corie Chiquito NP at [facility]    Headache     PTSD (post-traumatic stress disorder)     Surgical History: Past Surgical History:  Procedure Laterality Date   WISDOM TOOTH EXTRACTION  Medications:  No current facility-administered medications for this encounter.  Current Outpatient Medications:    busPIRone (BUSPAR) 30 MG tablet, Take 1 tablet (30 mg total) by mouth 2 (two) times daily., Disp: 180 tablet, Rfl: 0   divalproex (DEPAKOTE ER) 500 MG 24 hr tablet, Take 4 tablets (2,000 mg total) by mouth at bedtime., Disp: 120 tablet, Rfl: 1   Omega-3 Fatty Acids (FISH OIL) 1200 MG CAPS, Take 1 capsule by mouth in the morning and at bedtime., Disp: , Rfl:    amoxicillin-clavulanate (AUGMENTIN) 875-125 MG tablet, Take 1 tablet by mouth every 12 (twelve) hours. (Patient not taking: Reported on 11/27/2023), Disp: 14 tablet, Rfl: 0   propranolol (INDERAL) 10 MG tablet, Take 1-2 tabs po BID prn anxiety (Patient not taking: Reported on 11/27/2023), Disp: 120 tablet, Rfl: 1   QUEtiapine (SEROQUEL) 100 MG tablet, Take 1/2 tab po QHS x 1 night, then increase to 1 tab po QHS x 2 nights, then 2 tabs po QHS x 2 nights, then increase to 3 tabs po QHS (Patient not taking: Reported on 11/27/2023), Disp: 90 tablet, Rfl: 1   traZODone (DESYREL) 100 MG tablet, TAKE 1 TO 1 & 1/2 (ONE TO ONE & ONE-HALF) TABLETS BY MOUTH EVERY DAY AT BEDTIME AS NEEDED FOR INSOMNIA (Patient not taking: Reported on 11/27/2023), Disp: 145 tablet, Rfl: 0  Allergies: No Known Allergies  Chales Abrahams, NP

## 2023-11-28 NOTE — ED Notes (Signed)
Pt belongings returned to him for d/c. Pharmacy giving medication for home also sending with pt. Mother here to take pt home.

## 2023-11-28 NOTE — ED Notes (Signed)
Pt reminded of needing a urine sample

## 2023-11-28 NOTE — ED Provider Notes (Signed)
Emergency Medicine Observation Re-evaluation Note  David [name] is a 32 y.o. male, seen on rounds today.  Pt initially presented to the ED for complaints of V70.1 Currently, the patient is committed for suicidal ideation.  Physical Exam  BP (!) 142/77 (BP Location: Left Arm)   Pulse (!) 103   Temp 97.6 F (36.4 C) (Oral)   Resp 17   Ht 5\' 9"  (1.753 m)   Wt 68 kg   SpO2 97%   BMI 22.15 kg/m  Physical Exam Alert and in no acute distress  ED Course / MDM  EKG:   I have reviewed the labs performed to date as well as medications administered while in observation.  Recent changes in the last 24 hours include none.  Plan  Current plan is for psychiatric placement.    Bethann Berkshire, MD 11/28/23 [phone]

## 2023-11-28 NOTE — Discharge Instructions (Addendum)
Follow-up with [facility] in the next 3 days

## 2024-07-17 DIAGNOSIS — F319 Bipolar disorder, unspecified: Secondary | ICD-10-CM | POA: Diagnosis not present

## 2024-08-09 DIAGNOSIS — Z0000 Encounter for general adult medical examination without abnormal findings: Secondary | ICD-10-CM | POA: Diagnosis not present
